# Patient Record
Sex: Male | Born: 1965 | Race: White | Hispanic: No | Marital: Single | Smoking: Former smoker
Health system: Southern US, Community
[De-identification: ages and names within clinical notes are randomized; demographics above are authoritative.]

## PROBLEM LIST (undated history)

## (undated) DIAGNOSIS — F419 Anxiety disorder, unspecified: Secondary | ICD-10-CM

## (undated) DIAGNOSIS — E079 Disorder of thyroid, unspecified: Secondary | ICD-10-CM

## (undated) DIAGNOSIS — A6920 Lyme disease, unspecified: Secondary | ICD-10-CM

## (undated) DIAGNOSIS — F329 Major depressive disorder, single episode, unspecified: Secondary | ICD-10-CM

## (undated) DIAGNOSIS — F32A Depression, unspecified: Secondary | ICD-10-CM

## (undated) DIAGNOSIS — E039 Hypothyroidism, unspecified: Secondary | ICD-10-CM

## (undated) DIAGNOSIS — T7840XA Allergy, unspecified, initial encounter: Secondary | ICD-10-CM

## (undated) DIAGNOSIS — K259 Gastric ulcer, unspecified as acute or chronic, without hemorrhage or perforation: Secondary | ICD-10-CM

## (undated) DIAGNOSIS — K219 Gastro-esophageal reflux disease without esophagitis: Secondary | ICD-10-CM

## (undated) HISTORY — DX: Lyme disease, unspecified: A69.20

## (undated) HISTORY — PX: APPENDECTOMY: SHX54

## (undated) HISTORY — DX: Anxiety disorder, unspecified: F41.9

## (undated) HISTORY — DX: Gastro-esophageal reflux disease without esophagitis: K21.9

## (undated) HISTORY — DX: Allergy, unspecified, initial encounter: T78.40XA

## (undated) HISTORY — PX: CHOLECYSTECTOMY: SHX55

---

## 2000-08-31 ENCOUNTER — Emergency Department (HOSPITAL_COMMUNITY): Admission: EM | Admit: 2000-08-31 | Discharge: 2000-09-01 | Payer: Self-pay | Admitting: Emergency Medicine

## 2000-09-22 ENCOUNTER — Inpatient Hospital Stay (HOSPITAL_COMMUNITY): Admission: EM | Admit: 2000-09-22 | Discharge: 2000-10-01 | Payer: Self-pay | Admitting: Family Medicine

## 2000-09-22 ENCOUNTER — Encounter: Payer: Self-pay | Admitting: Internal Medicine

## 2001-02-21 ENCOUNTER — Encounter: Admission: RE | Admit: 2001-02-21 | Discharge: 2001-02-21 | Payer: Self-pay | Admitting: Rheumatology

## 2001-02-21 ENCOUNTER — Encounter: Payer: Self-pay | Admitting: Rheumatology

## 2001-03-27 ENCOUNTER — Encounter: Admission: RE | Admit: 2001-03-27 | Discharge: 2001-04-25 | Payer: Self-pay | Admitting: Anesthesiology

## 2001-06-15 ENCOUNTER — Encounter: Payer: Self-pay | Admitting: Anesthesiology

## 2001-06-15 ENCOUNTER — Ambulatory Visit (HOSPITAL_COMMUNITY): Admission: RE | Admit: 2001-06-15 | Discharge: 2001-06-15 | Payer: Self-pay | Admitting: Anesthesiology

## 2001-12-12 ENCOUNTER — Encounter: Payer: Self-pay | Admitting: Family Medicine

## 2001-12-12 ENCOUNTER — Ambulatory Visit (HOSPITAL_COMMUNITY): Admission: RE | Admit: 2001-12-12 | Discharge: 2001-12-12 | Payer: Self-pay | Admitting: Family Medicine

## 2002-03-02 ENCOUNTER — Encounter: Payer: Self-pay | Admitting: Family Medicine

## 2002-03-02 ENCOUNTER — Ambulatory Visit (HOSPITAL_COMMUNITY): Admission: RE | Admit: 2002-03-02 | Discharge: 2002-03-02 | Payer: Self-pay | Admitting: Family Medicine

## 2002-07-13 ENCOUNTER — Emergency Department (HOSPITAL_COMMUNITY): Admission: EM | Admit: 2002-07-13 | Discharge: 2002-07-14 | Payer: Self-pay | Admitting: Emergency Medicine

## 2002-12-11 ENCOUNTER — Emergency Department (HOSPITAL_COMMUNITY): Admission: EM | Admit: 2002-12-11 | Discharge: 2002-12-11 | Payer: Self-pay | Admitting: Emergency Medicine

## 2002-12-19 ENCOUNTER — Inpatient Hospital Stay (HOSPITAL_COMMUNITY): Admission: AD | Admit: 2002-12-19 | Discharge: 2002-12-20 | Payer: Self-pay | Admitting: Internal Medicine

## 2004-01-11 ENCOUNTER — Emergency Department (HOSPITAL_COMMUNITY): Admission: EM | Admit: 2004-01-11 | Discharge: 2004-01-11 | Payer: Self-pay | Admitting: Emergency Medicine

## 2004-01-12 ENCOUNTER — Emergency Department (HOSPITAL_COMMUNITY): Admission: EM | Admit: 2004-01-12 | Discharge: 2004-01-12 | Payer: Self-pay | Admitting: Emergency Medicine

## 2004-04-30 ENCOUNTER — Emergency Department (HOSPITAL_COMMUNITY): Admission: EM | Admit: 2004-04-30 | Discharge: 2004-04-30 | Payer: Self-pay | Admitting: Emergency Medicine

## 2004-07-16 ENCOUNTER — Emergency Department (HOSPITAL_COMMUNITY): Admission: EM | Admit: 2004-07-16 | Discharge: 2004-07-16 | Payer: Self-pay | Admitting: Emergency Medicine

## 2005-02-13 ENCOUNTER — Emergency Department (HOSPITAL_COMMUNITY): Admission: EM | Admit: 2005-02-13 | Discharge: 2005-02-14 | Payer: Self-pay | Admitting: Emergency Medicine

## 2005-08-16 ENCOUNTER — Emergency Department (HOSPITAL_COMMUNITY): Admission: EM | Admit: 2005-08-16 | Discharge: 2005-08-16 | Payer: Self-pay | Admitting: Emergency Medicine

## 2005-10-30 ENCOUNTER — Encounter: Payer: Self-pay | Admitting: Gastroenterology

## 2005-10-31 ENCOUNTER — Inpatient Hospital Stay (HOSPITAL_COMMUNITY): Admission: EM | Admit: 2005-10-31 | Discharge: 2005-11-02 | Payer: Self-pay | Admitting: Emergency Medicine

## 2005-10-31 ENCOUNTER — Encounter (INDEPENDENT_AMBULATORY_CARE_PROVIDER_SITE_OTHER): Payer: Self-pay | Admitting: *Deleted

## 2005-11-05 ENCOUNTER — Ambulatory Visit: Payer: Self-pay | Admitting: Gastroenterology

## 2005-11-16 ENCOUNTER — Ambulatory Visit: Payer: Self-pay | Admitting: Gastroenterology

## 2005-12-21 ENCOUNTER — Ambulatory Visit: Payer: Self-pay | Admitting: Gastroenterology

## 2006-03-05 ENCOUNTER — Emergency Department (HOSPITAL_COMMUNITY): Admission: EM | Admit: 2006-03-05 | Discharge: 2006-03-05 | Payer: Self-pay | Admitting: Emergency Medicine

## 2006-03-10 ENCOUNTER — Ambulatory Visit (HOSPITAL_BASED_OUTPATIENT_CLINIC_OR_DEPARTMENT_OTHER): Admission: RE | Admit: 2006-03-10 | Discharge: 2006-03-10 | Payer: Self-pay | Admitting: Urology

## 2006-03-10 ENCOUNTER — Encounter (INDEPENDENT_AMBULATORY_CARE_PROVIDER_SITE_OTHER): Payer: Self-pay | Admitting: Specialist

## 2006-07-07 ENCOUNTER — Ambulatory Visit (HOSPITAL_BASED_OUTPATIENT_CLINIC_OR_DEPARTMENT_OTHER): Admission: RE | Admit: 2006-07-07 | Discharge: 2006-07-07 | Payer: Self-pay | Admitting: Urology

## 2006-07-07 ENCOUNTER — Encounter (INDEPENDENT_AMBULATORY_CARE_PROVIDER_SITE_OTHER): Payer: Self-pay | Admitting: *Deleted

## 2006-08-29 ENCOUNTER — Emergency Department (HOSPITAL_COMMUNITY): Admission: EM | Admit: 2006-08-29 | Discharge: 2006-08-30 | Payer: Self-pay | Admitting: Emergency Medicine

## 2007-03-01 ENCOUNTER — Ambulatory Visit (HOSPITAL_COMMUNITY): Admission: RE | Admit: 2007-03-01 | Discharge: 2007-03-01 | Payer: Self-pay | Admitting: Anesthesiology

## 2007-08-11 ENCOUNTER — Ambulatory Visit (HOSPITAL_BASED_OUTPATIENT_CLINIC_OR_DEPARTMENT_OTHER): Admission: RE | Admit: 2007-08-11 | Discharge: 2007-08-11 | Payer: Self-pay | Admitting: Urology

## 2007-08-11 ENCOUNTER — Encounter (INDEPENDENT_AMBULATORY_CARE_PROVIDER_SITE_OTHER): Payer: Self-pay | Admitting: Urology

## 2008-01-29 ENCOUNTER — Encounter: Admission: RE | Admit: 2008-01-29 | Discharge: 2008-01-29 | Payer: Self-pay | Admitting: Internal Medicine

## 2008-01-29 ENCOUNTER — Encounter: Payer: Self-pay | Admitting: Gastroenterology

## 2008-01-30 ENCOUNTER — Encounter: Payer: Self-pay | Admitting: Gastroenterology

## 2008-01-31 ENCOUNTER — Telehealth: Payer: Self-pay | Admitting: Gastroenterology

## 2008-02-01 ENCOUNTER — Ambulatory Visit: Payer: Self-pay | Admitting: Internal Medicine

## 2008-02-01 DIAGNOSIS — R1012 Left upper quadrant pain: Secondary | ICD-10-CM

## 2008-02-01 DIAGNOSIS — R109 Unspecified abdominal pain: Secondary | ICD-10-CM | POA: Insufficient documentation

## 2008-02-01 DIAGNOSIS — Z8619 Personal history of other infectious and parasitic diseases: Secondary | ICD-10-CM

## 2008-02-01 DIAGNOSIS — K2970 Gastritis, unspecified, without bleeding: Secondary | ICD-10-CM | POA: Insufficient documentation

## 2008-02-01 DIAGNOSIS — R112 Nausea with vomiting, unspecified: Secondary | ICD-10-CM

## 2008-02-01 DIAGNOSIS — K2990 Gastroduodenitis, unspecified, without bleeding: Secondary | ICD-10-CM

## 2008-08-05 ENCOUNTER — Ambulatory Visit: Payer: Self-pay | Admitting: Gastroenterology

## 2008-08-07 LAB — CONVERTED CEMR LAB
AST: 43 units/L — ABNORMAL HIGH (ref 0–37)
Albumin: 4.1 g/dL (ref 3.5–5.2)
Alkaline Phosphatase: 92 units/L (ref 39–117)
Basophils Absolute: 0 10*3/uL (ref 0.0–0.1)
Basophils Relative: 0.7 % (ref 0.0–3.0)
Calcium: 9.4 mg/dL (ref 8.4–10.5)
Creatinine, Ser: 1.1 mg/dL (ref 0.4–1.5)
Eosinophils Relative: 4.4 % (ref 0.0–5.0)
Hemoglobin: 13.5 g/dL (ref 13.0–17.0)
Lymphocytes Relative: 44 % (ref 12.0–46.0)
MCHC: 35 g/dL (ref 30.0–36.0)
Neutro Abs: 2.6 10*3/uL (ref 1.4–7.7)
Potassium: 4.6 meq/L (ref 3.5–5.1)
RDW: 12.4 % (ref 11.5–14.6)
Sodium: 141 meq/L (ref 135–145)
Total Protein: 7.3 g/dL (ref 6.0–8.3)
WBC: 5.9 10*3/uL (ref 4.5–10.5)

## 2008-08-15 ENCOUNTER — Encounter (INDEPENDENT_AMBULATORY_CARE_PROVIDER_SITE_OTHER): Payer: Self-pay | Admitting: General Surgery

## 2008-08-15 ENCOUNTER — Ambulatory Visit (HOSPITAL_COMMUNITY): Admission: RE | Admit: 2008-08-15 | Discharge: 2008-08-16 | Payer: Self-pay | Admitting: General Surgery

## 2009-06-14 ENCOUNTER — Emergency Department (HOSPITAL_BASED_OUTPATIENT_CLINIC_OR_DEPARTMENT_OTHER): Admission: EM | Admit: 2009-06-14 | Discharge: 2009-06-14 | Payer: Self-pay | Admitting: Emergency Medicine

## 2009-06-20 ENCOUNTER — Inpatient Hospital Stay (HOSPITAL_COMMUNITY): Admission: RE | Admit: 2009-06-20 | Discharge: 2009-07-07 | Payer: Self-pay | Admitting: Psychiatry

## 2009-06-20 ENCOUNTER — Ambulatory Visit: Payer: Self-pay | Admitting: Psychiatry

## 2009-07-09 ENCOUNTER — Other Ambulatory Visit (HOSPITAL_COMMUNITY): Admission: RE | Admit: 2009-07-09 | Discharge: 2009-07-10 | Payer: Self-pay | Admitting: Psychiatry

## 2009-07-10 ENCOUNTER — Emergency Department (HOSPITAL_COMMUNITY): Admission: EM | Admit: 2009-07-10 | Discharge: 2009-07-10 | Payer: Self-pay | Admitting: Emergency Medicine

## 2009-07-10 ENCOUNTER — Inpatient Hospital Stay (HOSPITAL_COMMUNITY): Admission: AD | Admit: 2009-07-10 | Discharge: 2009-07-11 | Payer: Self-pay | Admitting: Psychiatry

## 2009-07-14 ENCOUNTER — Emergency Department (HOSPITAL_COMMUNITY): Admission: EM | Admit: 2009-07-14 | Discharge: 2009-07-15 | Payer: Self-pay | Admitting: Emergency Medicine

## 2009-07-15 ENCOUNTER — Other Ambulatory Visit (HOSPITAL_COMMUNITY): Admission: RE | Admit: 2009-07-15 | Discharge: 2009-07-16 | Payer: Self-pay | Admitting: Psychiatry

## 2009-07-16 ENCOUNTER — Inpatient Hospital Stay (HOSPITAL_COMMUNITY): Admission: RE | Admit: 2009-07-16 | Discharge: 2009-07-17 | Payer: Self-pay | Admitting: Psychiatry

## 2009-07-16 ENCOUNTER — Ambulatory Visit: Payer: Self-pay | Admitting: Psychiatry

## 2009-11-22 ENCOUNTER — Emergency Department (HOSPITAL_BASED_OUTPATIENT_CLINIC_OR_DEPARTMENT_OTHER): Admission: EM | Admit: 2009-11-22 | Discharge: 2009-11-22 | Payer: Self-pay | Admitting: Emergency Medicine

## 2010-01-29 ENCOUNTER — Ambulatory Visit: Payer: Self-pay | Admitting: Psychiatry

## 2010-01-29 ENCOUNTER — Inpatient Hospital Stay (HOSPITAL_COMMUNITY): Admission: RE | Admit: 2010-01-29 | Discharge: 2010-02-01 | Payer: Self-pay | Admitting: Psychiatry

## 2010-07-09 LAB — COMPREHENSIVE METABOLIC PANEL
AST: 25 U/L (ref 0–37)
Albumin: 4.1 g/dL (ref 3.5–5.2)
CO2: 23 mEq/L (ref 19–32)
Chloride: 109 mEq/L (ref 96–112)
GFR calc non Af Amer: >60 mL/min (ref >60)
Potassium: 3.8 mEq/L (ref 3.5–5.1)

## 2010-07-09 LAB — CBC
MCH: 31.6 pg (ref 26.0–34.0)
MCHC: 33.5 g/dL (ref 30.0–36.0)
MCV: 94.3 fL (ref 78.0–100.0)
RBC: 4.17 MIL/uL — ABNORMAL LOW (ref 4.22–5.81)

## 2010-07-09 LAB — DIFFERENTIAL
Eosinophils Absolute: 0 10*3/uL (ref 0.0–0.7)
Lymphs Abs: 1.7 10*3/uL (ref 0.7–4.0)
Monocytes Absolute: 0.3 10*3/uL (ref 0.1–1.0)

## 2010-07-17 LAB — COMPREHENSIVE METABOLIC PANEL
ALT: 20 U/L (ref 0–53)
AST: 21 U/L (ref 0–37)
Albumin: 4 g/dL (ref 3.5–5.2)
Albumin: 4.3 g/dL (ref 3.5–5.2)
Alkaline Phosphatase: 79 U/L (ref 39–117)
Alkaline Phosphatase: 78 U/L (ref 39–117)
BUN: 11 mg/dL (ref 6–23)
GFR calc Af Amer: >60 mL/min (ref >60)
Potassium: 4.3 mEq/L (ref 3.5–5.1)
Sodium: 141 mEq/L (ref 135–145)
Sodium: 138 mEq/L (ref 135–145)
Total Bilirubin: 0.5 mg/dL (ref 0.3–1.2)
Total Protein: 7.4 g/dL (ref 6.0–8.3)

## 2010-07-17 LAB — URINALYSIS, ROUTINE W REFLEX MICROSCOPIC
Bilirubin Urine: NEGATIVE
Bilirubin Urine: NEGATIVE
Bilirubin Urine: NEGATIVE
Glucose, UA: NEGATIVE mg/dL
Glucose, UA: NEGATIVE mg/dL
Hgb urine dipstick: NEGATIVE
Hgb urine dipstick: NEGATIVE
Ketones, ur: NEGATIVE mg/dL
Ketones, ur: NEGATIVE mg/dL
Protein, ur: NEGATIVE mg/dL
Protein, ur: NEGATIVE mg/dL
Specific Gravity, Urine: 1.021 (ref 1.005–1.030)
pH: 7.5 (ref 5.0–8.0)
pH: 5.5 (ref 5.0–8.0)

## 2010-07-17 LAB — CBC
HCT: 39.1 % (ref 39.0–52.0)
HCT: 40.7 % (ref 39.0–52.0)
MCHC: 34.4 g/dL (ref 30.0–36.0)
MCV: 94.4 fL (ref 78.0–100.0)
Platelets: 262 10*3/uL (ref 150–400)
RBC: 4.14 MIL/uL — ABNORMAL LOW (ref 4.22–5.81)
RDW: 13 % (ref 11.5–15.5)
WBC: 5.4 10*3/uL (ref 4.0–10.5)

## 2010-07-17 LAB — RAPID URINE DRUG SCREEN, HOSP PERFORMED
Amphetamines: NOT DETECTED
Benzodiazepines: POSITIVE — AB
Cocaine: NOT DETECTED
Cocaine: NOT DETECTED
Opiates: NOT DETECTED
Tetrahydrocannabinol: NOT DETECTED
Tetrahydrocannabinol: NOT DETECTED

## 2010-07-17 LAB — ETHANOL: Alcohol, Ethyl (B): <5 mg/dL (ref 0–10)

## 2010-07-17 LAB — BASIC METABOLIC PANEL
BUN: 18 mg/dL (ref 6–23)
Calcium: 9.2 mg/dL (ref 8.4–10.5)
GFR calc non Af Amer: >60 mL/min (ref >60)
Glucose, Bld: 94 mg/dL (ref 70–99)

## 2010-07-17 LAB — DIFFERENTIAL
Basophils Relative: 1 % (ref 0–1)
Monocytes Absolute: 0.3 10*3/uL (ref 0.1–1.0)
Monocytes Relative: 6 % (ref 3–12)
Neutro Abs: 3.1 10*3/uL (ref 1.7–7.7)

## 2010-08-05 LAB — DIFFERENTIAL
Eosinophils Absolute: 0.2 10*3/uL (ref 0.0–0.7)
Lymphocytes Relative: 32 % (ref 12–46)
Lymphs Abs: 1.4 10*3/uL (ref 0.7–4.0)
Monocytes Relative: 6 % (ref 3–12)
Neutrophils Relative %: 55 % (ref 43–77)

## 2010-08-05 LAB — COMPREHENSIVE METABOLIC PANEL
ALT: 18 U/L (ref 0–53)
AST: 21 U/L (ref 0–37)
Calcium: 8.8 mg/dL (ref 8.4–10.5)
Creatinine, Ser: 0.98 mg/dL (ref 0.4–1.5)
GFR calc Af Amer: >60 mL/min (ref >60)
Glucose, Bld: 102 mg/dL — ABNORMAL HIGH (ref 70–99)
Sodium: 141 mEq/L (ref 135–145)
Total Protein: 6 g/dL (ref 6.0–8.3)

## 2010-08-05 LAB — CBC
MCHC: 34.3 g/dL (ref 30.0–36.0)
MCV: 92 fL (ref 78.0–100.0)
RDW: 13.8 % (ref 11.5–15.5)

## 2010-09-08 NOTE — Op Note (Signed)
Glenn Santana, Glenn Santana               ACCOUNT NO.:  [id number]   MEDICAL RECORD NO.:  [id number]          PATIENT TYPE:  AMB   LOCATION:  [facility]                         FACILITY:  [facility]   PHYSICIAN:  Martina Sinner, MD DATE OF BIRTH:  March 02, 1966   DATE OF PROCEDURE:  DATE OF DISCHARGE:                               OPERATIVE REPORT   PREOPERATIVE DIAGNOSES:  Perineal absence.   POSTOPERATIVE DIAGNOSES:  Perineal absence.   PROCEDURE:  Excision of perineal abscess.   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  He has  had this excised before.  It has been refractory to antibiotics.  He is  currently on Keflex.  He has a second lesion in the perineum as well.  The first lesion is in the inferior aspect of his old perineal incision  scar.  There is little bit of an abscess effect with firmness extending  to the left side for 1-1/2 cm.   Both incisions were marked with marking pen.  Elliptical incisions were  used.  The smaller incision was approximately 13 mm in length.  A second  incision was 2-1/2 cm in length.  I removed the skin and a little bit of  subcutaneous tissue.  The base was fulgurated.  Both incisions were  closed with multiple 3-0 chromic sutures cut short.  Hemostasis was  excellent.  Dermabond was applied.  The patient was given preoperative  vancomycin and postop doxycycline.   These may represent MRSA infections and I will speak to Infectious  Diseases in the future.           ______________________________  Martina Sinner, MD  Electronically Signed     SAM/MEDQ  D:  08/11/2007  T:  08/11/2007  Job:  [id number]

## 2010-09-08 NOTE — Op Note (Signed)
NAMESADIK, PIASCIK               ACCOUNT NO.:  [id number]   MEDICAL RECORD NO.:  [id number]          PATIENT TYPE:  OIB   LOCATION:  0098                         FACILITY:  [facility]   PHYSICIAN:  Anselm Pancoast. Weatherly, M.D.DATE OF BIRTH:  1966/02/23   DATE OF PROCEDURE:  08/15/2008  DATE OF DISCHARGE:                               OPERATIVE REPORT   PREOPERATIVE DIAGNOSIS:  Chronic cholecystitis.   POSTOPERATIVE DIAGNOSIS:  Chronic cholecystitis.   OPERATION:  Laparoscopic cholecystectomy with cholangiogram.   ANESTHESIA.:  General.   SURGEON:  Anselm Pancoast. Zachery Dakins, M.D.   ASSISTANT:  Sandria Bales. Ezzard Standing, M.D.   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  His past history, he has had an appendectomy  years ago when he was living in [place], and then about 3 years ago he  was worked up for epigastric pain.  They did ultrasound; did not confirm  stones, but hepatobiliary scan was definitely abnormal and I think he  saw a surgeon, but he thought since his pain was in the upper abdomen  and sometimes to the left that it was not a gallbladder function.  He  recently has had more episodes of pain and contacted GI with the  [facility] and  talked with Dr. Christella Hartigan who reviewed his records and  thought that this was, and suggested that he be seen by a surgeon.  I  saw him early in the week and he said he had been nauseous over the  weekend and had not had recent lab studies, but his symptoms to me, and  now with an ultrasound that definitely shows stone, I think that  definitely is gallbladder and needs to be removed.  Now, whether it is  going to take care of most of his pain, I am not sure.  He has had a  chronic lower extremity problem.  He is on Duragesic patches.  He is on  other patches, antidepressants and also migraine headache medication and  Wellbutrin.  The  patient wanted to proceed as quickly as possible and OR  time was found today.  Preoperatively, the patient's repeat lab studies  are unremarkable.  He weighs about 230 pounds; he has been as heavy as  280 and his lab studies repeated show a normal white count of 4300,  hematocrit of 37 and I think the liver tests were also normal.   He was given Cipro preoperatively as HE IS ALLERGIC TO PENICILLIN and  taken back to the operative suite.  He says he was coming down with a  migraine right as far as the anesthesia and Dr. Rica Mast, his  anesthesiologist, said he had actually recognized him when he had seen  him when he had a cyst removed in the buttocks area by Dr. Sherron Monday.  The patient was positioned OR table.  Induction of general anesthesia,  endotracheal tube, oral tube into the stomach and then the abdomen which  is kind of protuberant, was prepped  with Betadine solution and draped in  a sterile manner.  A small incision was made below the umbilicus after  the time-out with identification, clamp sutures and etc. done and he had  received the Cipro.  The fascia was identified down at the depth that  you could just see it with the deep ends of an [employer] retractor and  then I picked up the fascia and made a little opening.  Could identify  the underlying peritoneum and picked that up also.  I then could see  into the peritoneal cavity after the peritoneum was opened.  A  pursestring suture of O Vicryl was placed and a second camera was  inserted.  He has significant adhesions in the right side of his abdomen  from his previous appendectomy, but not adhesions in the midline and we  could kind of go up.  I could just see the, to the right of the  falciform and the right, the upper 10-mL trocar was placed with direct  vision and then I could use the laparoscopic scissors and cautery to  kind of drop the adhesions from his previous appendectomy.  You could  not actually see the  gallbladder; you could see the omentum kind  adherent over the gallbladder and after the two lateral 5-mm trocars  were placed, we then started dissecting down into the omentum.  Could  just see the top of the gallbladder and then spent probably 45 minutes  trying to basically get this omentum off of a very floppy and a  chronically distended gallbladder, but not a thickened or acute  cholecystitis.  We did enter the gallbladder trying to strip the omentum  off of it and we aspirated the fluid and then continued dissecting and  it was finally getting so that we could get down so we could recognize  some actual gallbladder wall.  Several little vessels within the omentum  were clipped and then cauterized on the gall bladder surface and then we  could kind of get down to the proximal portion of the gallbladder that  was definitely a kind of a thin-walled, chronically distended  gallbladder.  The posterior branch of the cystic artery was first  identified and after we were sure that we clipped it proximally and  distally and divided it, and then we could kind of get to the dependent  portion of the gallbladder.  Dissected it laterally.  We then identified  the main cystic artery, doubly clipped it proximally and singly after we  were definitely confident that this was, and then we could encompass the  cystic duct where it had tapered on down and placed a clip at the  junction of the cystic duct gallbladder.  A small opening was made  proximally and a Cook catheter was introduced and a cholangiogram  obtained after being held in place with the clip.  He has got a small  extrahepatic biliary system with good flow of dye into the duodenum and  the intrahepatic ducts, and probably about a 2-cm, normal-sized cystic  duct.  I triply clipped the proximal cystic duct, then divided it and  the cystic artery had previously been clipped and then I kind of  carefully separated the gallbladder from the  liver bed with good  hemostasis.  The gallbladder was placed in an EndoCatch bag after it was  completely freed up.  We irrigated, aspirated and now satisfied with  hemostasis.  I switched the camera to the upper  10-mm trocar and then  withdrew the bag containing the gallbladder.  There are gallstones.  We  reinserted the camera again, irrigated and looked, were satisfied with  the hemostasis and then placed an additional figure-of-eight, in  addition to the pursestring at the umbilicus and tied both sutures at  the fascia at the umbilicus.  The irrigating fluid was aspirated.  No  evidence of any bleeding or other bile stainage after we had aspirated  with about 2 liters of saline, and then the 5-mm ports were withdrawn  under direct vision.  Where we had dropped the adhesions down was also  inspected and there no evidence of any bleeding and the CO2 released and  the upper 10-mm trocar withdrawn.  I did put a simple stitch in the  fascia at the subxiphoid area and then closed the subcutaneous wounds  with 4-0 Vicryl, benzoin and Steri-Strips on the skin.  The patient  wants to spend the night and we will continue him on his chronic  medications plus Vicodin for pain and hopefully he will be ready for  discharge in the morning.  I think that his pain is going to be  significantly less now.  Some of these chronic pain problems, of course,  will not be affected by having his gallbladder removed, but he has  definitely been having episodes of abdominal pain with all these marked  adhesions.  It was such a big area around the gallbladder.      Anselm Pancoast. Zachery Dakins, M.D.  Electronically Signed     WJW/MEDQ  D:  08/15/2008  T:  08/15/2008  Job:  [id number]   cc:   Rachael Fee, MD  [address]  [place], [place] [address]

## 2010-09-11 NOTE — Discharge Summary (Signed)
NAMERANDE, ROYLANCE               ACCOUNT NO.:  [id number]   MEDICAL RECORD NO.:  [id number]          PATIENT TYPE:  INP   LOCATION:  5732                         FACILITY:  [facility]   PHYSICIAN:  Kari Baars, M.D.  DATE OF BIRTH:  [birth year]   DATE OF ADMISSION:  10/31/2005  DATE OF DISCHARGE:  11/02/2005                                 DISCHARGE SUMMARY   DISCHARGE DIAGNOSES:  1.  Right upper quadrant pain, likely musculoskeletal due to retching.  2.  Mild gastritis.  3.  Recurrent nausea and vomiting, resolved, likely secondary to #2 versus      functional in nature.  4.  Chronic pain syndrome with a questionable diagnosis of prior Lyme      disease.  5.  Migraine headaches.  6.  Depression with generalized anxiety disorder.  7.  Hypothyroidism.  8.  Status post appendectomy.  9.  Mild gastroesophageal reflux disease.  10. Nicotine addiction.   DISCHARGE MEDICATIONS:  1.  Protonix 40 mg p.o. b.i.d.  2.  Flexeril 10 mg q.8h. p.r.n. muscle spasm and pain.  3.  Asacol 400 mg every eight hours as directed by Dr. Vear Clock, apparently      for pain control.  4.  Klonopin 2 mg q.8h.  5.  Lexapro 20 mg daily.  6.  Propranolol SA 80 mg daily.  7.  Morphine sulfate immediate release 30 mg every four hours as needed for      severe pain (no refills or prescription provided).  8.  Fentanyl patch 25 mcg q.48h. as provided by Dr. Vear Clock.  9.  Synthroid 50 mcg daily.  10. Temazepam 15 mg q.h.s. p.r.n. sleep.   HOSPITAL PROCEDURES:  1.  Acute abdominal series October 30, 2005 (bronchitic changes with      nonobstructive bowel gas pattern).  2.  Right upper quadrant ultrasound (October 30, 2005) (gallbladder wall upper      limits of normal in thickness).  Positive sonographic Murphy's sign,      small gallbladder polyps, but no stones were seen.  3.  EGD (Dr. Rob Bunting) mild gastritis.  H. pylori negative.  4.  HIDA scan with ejection fraction (November 01, 2005) patency of cystic and    biliary duct.  Gallbladder ejection fraction at one hour is 49% which is      slightly below the range of normal.   CONSULTATIONS:  Gastroenterology (Dr. Rob Bunting).   HISTORY OF PRESENT ILLNESS:  For full details, please see dictated history  and physical by Dr. Timothy Lasso.  Briefly, Glenn Santana is a 45 year old white male  with a history of chronic pain syndrome, previously questionably diagnosed  as Lyme disease, undergoing extensive treatment, migraine headache, obesity  who presented to the emergency room on October 30, 2005 with recurrent nausea  and vomiting and abdominal pain which had progressed over the past two  weeks.  He stated that initially he developed postprandial nausea and  vomiting about 30 minutes after he would eat.  This was followed by severe  right upper quadrant pain.  The pain was worse with certain movements  and  possibly worse after eating.  He had no change in bowel symptoms.  He  presented to the emergency department where acute abdominal series showed no  significant obstruction.  Right upper quadrant ultrasound was performed.  It  was relatively unremarkable.  General surgery (Dr. Lindie Spruce) consulted on the  patient in the emergency department and did not feel that this was surgical  in nature.  They recommended further medical management.   HOSPITAL COURSE:  The patient was admitted to a medical bed.  Gastroenterology was consulted.  The patient was seen by Dr. Rob Bunting  and underwent an EGD on October 31, 2005 which revealed mild gastritis.  He did  not feel that this was a likely culprit for his severe pain.  A HIDA scan  was performed to further evaluate gallbladder disease.  This showed no  significant findings.  After reviewing his history, it seems that his pain  is more musculoskeletal in nature.  He tends to have exacerbation with  movements and with light touch over the skin.  There has been no significant  change in his bowel symptoms to suggest  colitis or inflammatory bowel  disease.  The patient was treated supportively with heating pad, Flexeril,  and p.r.n. Dilaudid which was transitioned to Vicodin.  Initially, he was  requiring large amounts of narcotics but this was weaned down to p.o.  Vicodin with excellent control in the morning of discharge.  I discussed the  patient's syndrome with him at length.  There does appear to be a large  component of anxiety or depression relating to this episode.  The patient  does seem to have an exaggerated response to all of his medical issues which  I think stem from prior life experiences.  I have encouraged him to follow-  up with his psychologist to discuss these issues.  He should also follow-up  with Dr. Vear Clock for ongoing pain management and consideration of  additional treatment for his right upper quadrant pain which is likely  musculoskeletal in etiology.  I have not given him any additional pain  medicines at this time.  We are recommending t.i.d. PPI to help with his  mild gastritis.   DISCHARGE INSTRUCTIONS:  The patient was instructed to call with worsening  abdominal pain or inability to tolerate p.o.'s.   LABORATORY DATA:  CBC on the morning of discharge white count 6.0,  hemoglobin 13.5, platelets 202.  BMET normal with sodium 138, potassium 3.6,  chloride 105, bicarb 27, BUN 10, creatinine 0.9, glucose 91.  H. pylori  negative.  B12 normal at 419.  TSH 5.5.  Amylase and lipase were normal on  admission.  Liver function tests were completely normal on admission as  well.   DISPOSITION:  To home with family.   FOLLOWUP:  He will follow up with Dr. Vear Clock on Thursday as scheduled.  He  will follow up with Dr. Clelia Croft at [facility] in two weeks.  He will follow up with GI as needed.      Kari Baars, M.D.  Electronically Signed     WS/MEDQ  D:  11/02/2005  T:  11/02/2005  Job:  [id number]

## 2010-09-11 NOTE — Assessment & Plan Note (Signed)
[facility]                           GASTROENTEROLOGY OFFICE NOTE   Glenn Santana, Glenn Santana                      MRN:          [id number]  DATE:12/21/2005                            DOB:          12-22-1965    PROBLEM LIST:  1.   New acute diarrhea, watery nonbloody stools began shortly after his      hospitalization in early July.  Of note, he was on antibiotics around      the same time for upper respiratory symptoms.  Workup in progress.  2.  Nausea and vomiting, chronic.  Unclear etiology.  Workup to date      includes essentially normal EGD July 2007, CLO biopsy negative.      Essentially normal HIDA scan July 2007 with a very minimally reduced      gallbladder ejection fraction.  Ultrasound July 2007 with mildly      thickened gallbladder (The patient was evaluated by General Surgery who      did not feel this was signs of cholecystitis.)  Persistently normal      liver tests and CBC.   INTERVAL HISTORY:  I last saw Glenn Santana one month ago at that time he began  noticing rather acute onset diarrhea.  It has now been going on  approximately two months.  He describes this as relatively loose stools,  some urgency, these can come and go but is definitely a change from what he  is used to.  This did seem to be timed around shortly after some antibiotics  for an upper respiratory infection so I thought this was probably C-  difficile related.  He did have stool testing done and these were all  negative including Giardia, corpus peridium, C-diff.  White cells were  negative.  Routine stool culture was negative.  My suspicion was high enough  that I did treat him with Flagyl 250 mg t.i.d. He took this for a two week  course and did not really know too much of a difference after that.  For the  past week or so he has been on Doxycycline for a bug bite on his finger.   CURRENT MEDICATIONS:  1. Asacol.  2. Clonazepam.  3. Lexapro.  4.  Levothyroxine.  5. Propranolol.  6. Phentanyl.  7. __________  8. Prilosec.  9. Doxycycline which he just finished.   PHYSICAL EXAMINATION:  VITAL SIGNS:  Weight 201 pounds.  Up one pound since  his last visit.  Blood pressure 120/72.  Pulse 60.  CONSTITUTIONAL:  Generally well-appearing neurological, alert and oriented  x3.  EYES:  Extra-ocular movements intact.  ABDOMEN:  Soft, non-tender, non-distended.  Normal bowel sounds.   ASSESSMENT/PLAN:  32. A 45 year old man with two months of loose stools.   RECOMMENDATIONS:  I recommended that we proceed with colonoscopy as soon as  convenience, however he is very reluctant to proceed with that because he  feels it will be very uncomfortable and he also does not know that he wants  to take off time from work.  He will get  back in touch with  me if he changes his mind.  In the meantime, I told him  to start taking Imodium one pill once to twice a day and that may help his  bowels firm up a bit.                                   Rachael Fee, MD   DPJ/MedQ  DD:  12/21/2005  DT:  12/22/2005  Job #:  [id number]   cc:   Kari Baars, MD

## 2010-09-11 NOTE — Discharge Summary (Signed)
[facility]  Patient:    Glenn Santana, Glenn Santana                      MRN: [id number] Adm. Date:  [id number] Disc. Date: [id number] Attending:  Janifer Adie CC:         Arvella Merles, M.D., [facility]  Dr. Yetta Barre, Dermatologist  Dewayne Shorter, M.D.  Rockey Situ. Flavia Shipper., M.D.   Discharge Summary  DATE OF BIRTH:  Jul 23, 1965  DISCHARGE DIAGNOSES: 1. Intractable lower extremity pain of unknown etiology. 2. Possible Lyme disease. 3. Smoking. 4. Mild elevation of the diastolic dysfunction, needs outpatient follow-up. 5. Allergies to penicillin.  DISCHARGE MEDICATIONS: 1. Doxycycline 100 mg p.o. b.i.d. x 14 more days (to complete 28-day therapy). 2. Prednisone 5 mg p.o. q.d. x 1 week, then 2.5 mg p.o. q.d. x 1 week, then    stop. 3. Klonopin 0.5 mg b.i.d. 4. Celexa 10 mg p.o. q.d. 5. Colace 100 mg p.o. q.d. 6. OxyContin 40 mg p.o. b.i.d. (20 pills given). 7. OxyIR ______ 10 mg q.4h. breakthrough pain p.r.n., 30 tablets given. 8. Phenergan 25 mg q.6h. p.r.n.  DISCHARGE FOLLOW-UP AND DISPOSITION:  Glenn Santana is being discharged home to the care of his family.  A home health physical therapy will be provided to continue with increased strengthening and ambulation skills.  The patient will be followed by Dr. Tiburcio Pea within three to five days in [facility].  The severe pain complaints remain completely unclear.  We all feel that the pain was completely out of proportion than that normally seen in these cases.  These complaints of pain were completely inconsistent. The patient was seen taking showers in the hospital and he was able to get in the wheelchair to go outside to smoke on multiple occasions despite medical advice.  We believe that this patient could benefit from a psychological and psychiatric evaluation as well as a possible referral to the pain clinic if his complaints of pain do not improve.  There was  not any evidence of focal weakness, no neuropathic pain.  CONSULTATIONS:  Dr. Lenn Sink and Dr. Cliffton Asters, infectious diseases.  PROCEDURES:  Chest x-ray negative.  DISCHARGE LAB DATA:  Negative HIV ELISA, negative HIV viral load by PCR, negative rheumatoid factor, negative RPR, negative HLA-B B27, negative serologies for Lyme disease, negative ANA, negative RA, negative serologies for hepatitis B and C, normal CK, normal sedimentation rate.   C and B serologies pending.  Epstein-Barr virus IgG positive at 4.40, IgM negative consistent with a remote infection.  Lower extremity venous Dopplers negative.  HOSPITAL COURSE:  Glenn Santana is a very pleasant 45 year old man admitted to [facility] on Sep 22, 2000, with a five-week history of transient painful skin rash in the lower extremities, subjective fever, headaches, ankle and knee pain that started with mild synovitis and questionable arthritis. Please see admission H&P by Dr. Charissa Bash for further details regarding the history of presentation, physical exam, and lab data.  #1 - POSSIBLE LYME DISEASE:  Given the patients presentation of skin rash in the lower extremities for about five weeks associated with arthralgias, subjective fever, a tick-borne infection was considered among other etiologies.  The sedimentation rate was completely negative and vasculitis was not evident.  The patient had been followed by Dr. Yetta Barre, dermatologist in town who had placed Glenn Santana on NSAIDS and prednisone as well as pain control.  The  patient received IV fluids and intravenous morphine for pain control.  Dr. Lenn Sink saw the patient on day #2.  Studies for HIV, syphilis, CMV, and Epstein-Barr virus, autoimmune processes like rheumatoid arthritis, and other autoimmune diseases related were obtained,  The patient was started on IV doxycycline.  All these studies came back negative.  Despite negative line serologies, the  infectious diseases consultant recommended to continue and complete a 28-day therapy with doxycycline.  The etiology behind the patients symptoms and complaints remain completely unclear by the time of discharge.  No deep venous thrombosis was found by lower extremity venous Dopplers.  No skin rash was documented during this hospital stay except for occasional localized small patch of erythema in the left calf.  No signs of objective inflammation of the knees or ankles were noticed throughout this hospital stay.  The patient was complaining of intractable pain in the right knee and right ankle that was completely out of proportion.  The patient was evaluated by physical therapy who noticed excruciating pain with minimal movement.  This pain was inconsistent as this patient was able to take some showers ______ with his friend here in the hospital, and he was able to transfer to the wheelchair without problems, to go downstairs to smoke for at least six times a day.  Despite his inconsistent history of pain, we continued using intravenous pain management.  We started MS Contin and, then, this medication was switched to OxyContin.  OxyIR was used for breakthrough pain. The patient started having some improvement in his pain though he insisted on getting intravenous morphine as he desired.  The nursing staff raised the concern of the drug seeking behavior.  Overall, the nursing staff team and our team felt that the patients request for narcotics and his excruciating pain was inconsistent and was suspicious.  I believe that this gentleman has a psychological/psychiatric disorder.  He was reluctant to see different doctors as he stated multiple times, "I just keep seeing multiple doctors and  everybodys giving me a different story."  The daily interactions with this gentleman were difficult as he continued requesting to stay in the hospital when all of his lab data remained negative and,  objectively, there was not an unstable medical problem.  Finally, we confronted Glenn Santana about the need to have to be discharged from the hospital and continue with medical management as an outpatient.  Dr. Holley Bouche would be in charge of creating a contract and plan with this patient.  We believe that this plan should be a combination of psychiatry/psychological evaluation as well as possible pain clinic referral.  #2 - INTRACTABLE RIGHT KNEE AND ANKLE PAIN OF UNKNOWN ETIOLOGY:  As in problem #1.  #3 - TOBACCO ABUSE:  We discussed on multiple occasions smoking cessation though the patient declined nicotine patch or any attempt of cessation. Dr. Leonides Sake will continue encouraging this patient to quit smoking.  #4 - MILD DIASTOLIC BLOOD PRESSURE ELEVATION:  During this hospital stay, some borderline mild hypertension was noticed.  We decided not to start therapy as this patient had continuous complaints of pain.  Dr. Holley Bouche will continue monitoring the patients blood pressure and decide if Mr. Pape needs antihypertensive agents.  I spent about 50 minutes in the process of discharging this patient.  Medical condition at the time of discharge has improved. DD:  10/01/00 TD:  10/02/00 Job: [id number]

## 2010-09-11 NOTE — H&P (Signed)
NAME:  BIRAN, MAYBERRY                         ACCOUNT NO.:  [id number]   MEDICAL RECORD NO.:  [id number]                   PATIENT TYPE:  INP   LOCATION:  0462                                 FACILITY:  [facility]   PHYSICIAN:  Corinna L. Lendell Caprice, MD             DATE OF BIRTH:  07/23/1965   DATE OF ADMISSION:  12/19/2002  DATE OF DISCHARGE:                                HISTORY & PHYSICAL   CHIEF COMPLAINT:  Pain.   HISTORY OF PRESENT ILLNESS:  [name] is a 45 year old white male with Lyme  disease and chronic right leg pain who was directly admitted from Dr.  Johnathan Hausen office with intractable pain.  He reports that the pain started two  years ago and he has seen multiple specialists including a pain management  doctor and a Lyme disease specialist.  The pain has been manageable despite  its chronicity; however, recently he reported that Neurontin was started and  this made his pain worse.  Since then, he has gone downhill and he is barely  able to ambulate even with a cane.  It keeps him up at night and he is only  sleeping about three hours a night.  He is unable to work and is cared for  by his mother.  He takes Duragesic patches as well as morphine as needed as  well as multiple anxiolytics and sleep aids, but the pain is unrelenting.  The workup has been very extensive including infectious disease serologies,  connective tissue workup, neurologic workup, orthopedic workup, pain  management, physical therapy, etc.  He was recently started on Minocin and  azithromycin for the Lyme disease and had been treated for Lyme disease with  doxycycline several years ago.   PAST MEDICAL HISTORY:  As above.  In addition he has depression and anxiety.   SOCIAL HISTORY:  The patient smokes.  He lives with his mother.  He drinks  occasionally.  He is unemployed.   FAMILY HISTORY:  Noncontributory.   REVIEW OF SYSTEMS:  CONSTITUTIONAL:  No fevers, chills.  He does have a  problem with  weight gain which he attributes to courses of prednisone.  HEENT:  He also suffers from occasional migraine though has no headache now.  RESPIRATORY:  No cough, shortness of breath.  CARDIOVASCULAR:  No chest pain  or palpitations.  GI:  No nausea, vomiting.  GU:  No dysuria.  MUSCULOSKELETAL:  As above.  PSYCHIATRIC:  As above.  NEUROLOGICAL:  No  seizures.  ENDOCRINE:  No diabetes.  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   PHYSICAL EXAMINATION:  VITAL SIGNS:  The patient has normal vital signs.  GENERAL:  The patient is slightly overweight, in no acute distress.  He  appears slightly groggy.  HEENT:  Normocephalic, atraumatic.  Pupils equal, round, reactive to light.  Sclerae nonicteric.  Moist mucous membranes.  Extraocular movements are  intact.  NECK:  Supple.  No lymphadenopathy.  No thyromegaly.  LUNGS:  Clear to auscultation bilaterally without wheezes, rhonchi, or  rales.  CARDIOVASCULAR:  Regular rate and rhythm without murmurs, rubs, or gallops.  ABDOMEN:  Normal bowel sounds.  Soft, nontender, nondistended.  GENITOURINARY  AND RECTAL:  Deferred.  EXTREMITIES:  He has no edema.  His right leg is exquisitely tender even to  light touch, more so than the left leg and he has decreased range of motion  in both the knees but especially in the ankles due to pain.  PSYCHIATRIC:  The patient has a somewhat flat affect but good eye contact.  NEUROLOGICAL:  The patient is groggy-appearing but oriented.  Sensory motor  exam is grossly intact.  SKIN:  He has an erythematous papular rash along the soles of both feet.   ASSESSMENT AND PLAN:  1. Chronic Lyme disease.  2. Chronic right leg pain now with an exacerbation resulting in decreased     quality of life and ambulatory status.  The patient will be admitted to     the floor.  I will increase his Duragesic patch to 125 mcg q.72 h. and     give a trial of a  morphine PCA.  We will need to be cognitive of the fact     that he is on not only these narcotics but as well he takes temazepam,     Remeron, clonazepam all of which can be sedating.  I will continue his     Zithromax and Minocin and give stool softeners.  Also while he is here, I     will order some physical therapy.  3. Depression.  4. Anxiety.                                               Corinna L. Lendell Caprice, MD    CLS/MEDQ  D:  12/19/2002  T:  12/19/2002  Job:  [id number]   cc:   Holley Bouche, M.D.  [address]  [place], [place] [address]  Fax: [phone]

## 2010-09-11 NOTE — Assessment & Plan Note (Signed)
[facility]                           GASTROENTEROLOGY OFFICE NOTE   Glenn Santana, Glenn Santana                      MRN:          [id number]  DATE:11/16/2005                            DOB:          21-Oct-1965    PRIMARY CARE PHYSICIAN:  Dr. Martha Clan.   PROBLEMS:  1.  New acute diarrhea, watery nonbloody stools began shortly after his      hospitalization in early July.  Of note, he was on antibiotics around      the same time for upper respiratory symptoms.  Workup in progress.  2.  Nausea and vomiting, chronic.  Unclear etiology.  Workup to date      includes essentially normal EGD July 2007, CLO biopsy negative.      Essentially normal HIDA scan July 2007 with a very minimally reduced      gallbladder ejection fraction.  Ultrasound July 2007 with mildly      thickened gallbladder (The patient was evaluated by General Surgery who      did not feel this was signs of cholecystitis.)  Persistently normal      liver tests and CBC.   INTERVAL HISTORY:  I last saw Mr. Dannemiller while he was hospitalized at [facility] for upper abdominal pain, nausea and vomiting.  The pain at  that time had been going on for several weeks.  It was exquisitely tender,  even to very light touch in his right upper quadrant and lower right ribs,  simply brushing his skin on that side elicited dramatic pain.  The pain was  very positional (for example worse when raising his arms).  He had been  nauseous around the same time.  General Surgery consultation did not feel  that his gallbladder, although mildly thickened on ultrasound, was a  problem.  EGD was performed which was essentially normal.  His pain and  perhaps his nausea were contributed to possibly functional symptoms.  He  tells me now that he has had similar pains in his right upper quadrant  dating back to the time when he had his appendix out 20 years ago.  Apparently, he required some type of percutaneous  nerve blockage shortly  within a few months of having his appendix removed to numb some nerves that  were irritated.  He has also had chronic pains in his abdomen and in his  legs from Lyme disease.  He regularly sees a Pain Clinic specialist and is  on Fentanyl patch 25 mg every other day as well as morphine p.r.n. which he  seems to take on a daily basis.  This is prescribed for leg pain, but he  also takes it for intermittent abdominal pain as well.   I did not feel that his symptoms in the hospital were GI related.  Since he  left the hospital, he was fairly constipated at first.  He was told to take  the prune juice.  Around the same time he took a Z-pack for some upper  respiratory symptoms, and since then he has had dramatic, watery diarrhea, 3-  4  times a day, sometimes getting him up at night.  He feels like he is weak  and getting dehydrated.  He is also continuing to have the vomiting and pain  that he had prior to his admission.  He had a CT scan yesterday of his  abdomen and pelvis.  CT scan showed no acute inflammatory process in his  abdomen.  However, there were scattered areas of apparent circumferential  transverse colon wall thickening.  However, no definite colonic wall  thickening is confirmed.  I am not seeing this scan myself.   Labs done yesterday show normal CBC, normal complete metabolic profile  except for mildly elevated potassium of 5.6.  Normal amylase and normal  lipase.   CURRENT MEDICATIONS:  1.  Asacol.  2.  Clonazepam.  3.  Lexapro.  4.  Levothyroxine.  5.  Propranolol.  6.  Fentanyl.  7.  __________.  8.  Multivitamin.  9.  Prilosec.  10. P.r.n. Phenergan.  11. P.r.n. morphine.  12. P.r.n. Ativan.  13. P.r.n. cyclobenzaprine.  14. P.r.n. Imitrex.   PHYSICAL EXAMINATION:  VITAL SIGNS:  He weighs 200 pounds, blood pressure  110/72, temperature 97.7.  GENERAL:  Well appearing, although, appears somewhat uncomfortable.  Alert  and oriented  x3.  HEENT:  EYES:  Extraocular movements intact.  MOUTH:  Oropharynx moist.  LUNGS:  Clear to auscultation bilaterally.  HEART:  Regular rate and rhythm.  ABDOMEN:  Soft, exquisitely tender to very light palpation and even soft  touch of his skin and the right side of his abdomen.  Normal bowel sounds.  EXTREMITIES:  No lower extremity edema.  SKIN:  No rash, lesions, __________   ASSESSMENT/PLAN:  A 45 year old man with myriad GI complaints.   First, his pain that brought him to the hospital and continues in his right  upper side did not seem to be GI in origin.  His tenderness is elicited on  very light touch to his skin and light palpation plus multiple imaging  studies seemed to show no evidence of disease in his abdomen.  I cannot  explain his pain.  Perhaps, he has non-rash zoster (?).  Some of his pain  may be functional.  He has chronic migraines, chronic pain syndrome, both of  which are associated with function abdominal discomforts.  For his nausea, I  have recommended that he add Zofran 8 mg once daily to see if that will help  his nausea symptoms.  He does have, however, a new acute diarrhea, and  perhaps he contracted C. difficile from his hospitalization along with  recent antibiotics.  There was a suggestion of colitis on a CT scan,  although, the report is certainly under-whelming for obvious inflammation.   I will have him get his stool studies done today for C. difficile, ova and  parasites, white cells and I have given him a prescription for Flagyl 250 mg  t.i.d.  He will begin taking the Flagyl after he gives the stool sample, as  I have seen many cases of toxin negative C. difficile colitis recently.  I  have also given him a prescription for the Zofran as above.  He will return  to see me in 4-5 weeks time, and I will refer him to his pain specialist to  continue pain medicine treatment.  Rachael Fee, MD   DPJ/MedQ DD:   11/16/2005  DT:  11/16/2005  Job #:  [id number]   cc:   Kari Baars, MD  Kathrin Penner. Vear Clock, MD

## 2010-09-11 NOTE — Op Note (Signed)
NAMEGARNET, OVERFIELD               ACCOUNT NO.:  [id number]   MEDICAL RECORD NO.:  [id number]          PATIENT TYPE:  AMB   LOCATION:  [facility]                         FACILITY[facility]  [facility]   PHYSICIAN:  Martina Sinner, MD DATE OF BIRTH:  01/13/1966   DATE OF PROCEDURE:  DATE OF DISCHARGE:                               OPERATIVE REPORT   PREOPERATIVE DIAGNOSIS:  Draining sinus and sebaceous cyst on the  scrotum.   POSTOPERATIVE DIAGNOSIS:  Draining sinus and sebaceous cyst on the  scrotum.   SURGERY:  Excision of sebaceous cyst and sinus.   Mr. Dyar had a sebaceous cyst excised by Dr. Etta Grandchild but continues to  have a draining sinus in the inferior aspect of his scrotum just at the  junction of the perineum.  It has not responded to sitz baths and  antibiotics.   The patient was prepped and draped in the usual fashion.  I could  palpate the sinus area.  It was not that deep.  I did a wide elliptical  incision and removed the indurated area and approximately 3/4 cm of  subcutaneous tissue.  I did a lot of palpation and visibly and palpably  the entire area was excised.  Approximately one inch inferior to the  incision he may be developing another sebaceous cyst.  I did not feel it  was appropriate to explore this area.  There is no question it was  separate from this problem.  After adequate hemostasis I closed the  subcutaneous tissue with running 3-0 Vicryl.  I closed the skin with  interrupted chromic suture.  A pressure dressing with Telfa was applied.  The patient was taken to the recovery room and, hopefully, this  procedure will prove curative.           ______________________________  Martina Sinner, MD  Electronically Signed     SAM/MEDQ  D:  07/07/2006  T:  07/08/2006  Job:  [id number]

## 2010-09-11 NOTE — Op Note (Signed)
Glenn Santana, Glenn Santana               ACCOUNT NO.:  [id number]   MEDICAL RECORD NO.:  [id number]          PATIENT TYPE:  AMB   LOCATION:  [facility]                         FACILITY:  [facility]   PHYSICIAN:  Claudette Laws, M.D.  DATE OF BIRTH:  [birth year]   DATE OF PROCEDURE:  DATE OF DISCHARGE:  03/05/2006                               OPERATIVE REPORT   PREOPERATIVE DIAGNOSIS:  Sebaceous cyst of the scrotum.   POSTOPERATIVE DIAGNOSIS:  Sebaceous cyst of the scrotum.   PROCEDURE:  Excision of scrotal cyst.   SURGEON:  Claudette Laws, M.D.   ASSISTANT:  Terie Purser, M.D.   ANESTHESIA:  General.   SPECIMEN:  Cyst.   COMPLICATIONS:  None.   DISPOSITION:  Stable to the post anesthesia care unit.   INDICATIONS:  Glenn Santana is a 45 year old male who has a symptomatic  subcutaneous cyst in the inferior aspect of his scrotum.  He has desired  intervention for this.  The benefits and risks of excision were  explained and consent was obtained.   DESCRIPTION OF PROCEDURE:  The patient was brought to the operating room  and properly identified.  A time-out was performed to confirm correct  patient and procedure.  He was administered general anesthesia, given  preoperative antibiotics, and placed in a frog-leg position on the  operating table, prepped and draped in a sterile fashion.  He did have  an approximately 2 cm sebaceous cyst at the inferior aspect of the  scrotum near the perineum.  A 2 cm incision was made overlying the cyst.  The cyst was dissected out sharply and removed intact.  Bleeders were  coagulated.  A subcutaneous layer of 3-0 chromic was placed for  hemostasis.  The skin was then closed, after  copious irrigation of the wound, with a 4-0 running chromic suture.  Collodion was applied to the skin.  A fluff gauze and scrotal support  were placed.  There were no complications.  Please note Dr. Etta Grandchild was  present and participated in all aspects of the procedure.     ______________________________  Terie Purser, MD      Claudette Laws, M.D.  Electronically Signed    JH/MEDQ  D:  03/10/2006  T:  03/10/2006  Job:  [id number]

## 2010-09-11 NOTE — Consult Note (Signed)
[facility]  Patient:    [name], Santana Visit Number: [id number] MRN: [id number]          Service Type: PMG Location: [facility] Attending Physician:  Rolly Salter Dictated by:   Jewel Baize. Stevphen Rochester, M.D. Admit Date:  03/27/2001   CC:         Arvella Merles, M.D.   Consultation Report  [name] is a pleasant 45 year old kindly referred to [name] by Dr. Tiburcio Pea. He is complaining of pain in his right knee and right leg.  Approximately May 2002 he noticed a bite in his left leg that progressed to arthralgias and subsequently was diagnosed as Lyme disease.  He has had extensive work-up in this regard, is a nonsurgical position, and has had declining functional indexes and quality of life indexes.  He describes his pain as a central amplification with notable allodynia and modest pseudo motor changes.  This is directed mostly to the right at this time and he relates poor restorative sleep capacity and functional indexes.  In relating his pain it is an 8/10 on a subjective scale.  This causes depression and moodiness, but states no wish to harm himself or others.  Aching, throbbing, sometimes sharp, weakness, stabbing pain.  He is improved with heat and some medications.  He does describe resistance to his current medication profile. It is made worse with walking, bending, sitting, working, and therapy.  X-rays and MRI as well as bone scan have been obtained and are reviewed.  MEDICATIONS:  1. Clonazepam.  2. OxyContin 40 mg b.i.d.  3. Zoloft.  4. Sonata.  5. Oxycodone one tablet t.i.d.  6. Nexium.  7. Promethazine.  8. Maxalt.  9. Lidoderm. 10. Diclofenac. 11. ______.  ALLERGIES:  PENICILLIN.  States no wish to harm himself or others.  The 14 point review of systems, health and history form are reviewed.  PAST SURGICAL HISTORY:  Appendectomy.  PAST MEDICAL HISTORY:  Remarkable for migraines that have been worked up and benign.  FAMILY  HISTORY:  Remarkable for heart disease, cancer, diabetes, disability, hypertension.  SOCIAL HISTORY:  He is a smoker one pack per day.  I cautioned.  Occasional alcohol.  He is single and lives with his mother.  He is currently not working secondary to his pain.  REVIEW OF SYSTEMS:  Otherwise noncontributory to pain problem.  PHYSICAL EXAMINATION  GENERAL:  Pleasant, obese male sitting comfortably in bed.  Gait, affect, appearance are normal.  Oriented x 3.  HEENT:  Unremarkable.  CHEST:  Clear to auscultation, percussion.  CARDIAC:  Regular rate and rhythm without rub, murmur, gallop.  ABDOMEN:  Soft, nontender, benign.  No hepatosplenomegaly.  EXTREMITIES:  He has modest allodynia to the right knee with decreased range of motion secondary to pain.  He has intact joint with no evidence of pseudo motor changes, erythema, or edema.  He has adequate vascular integrity distal to the presentation.  IMPRESSION:  CRPS to the joint secondary to Lyme disease.  PLAN: 1. Diagnostic and therapeutic lumbar sympathetic block. 2. Continue on OxyContin at this time but I will probably switch him over to    Duragesic as he is finding that he is becoming more resistant to OxyContin    and I think an agent change would be useful. 3. Neurontin and topamax will be escalated.  Added benefit of topamax is    weight control. 4. Add Elavil for help with restorative sleep capacity.  Discontinue Sonata.    Will  continue the clonazepam.  Lidoderm patche is at p.r.n.  I really do    not think that the diclofenac or the ______ is helping [name] that much but we    will not stop these at this time and consider this at a later date. 5. Strongly urge him to consider discontinuation of cigarettes as best outcome    ______ development.  Also consider tens technology and ongoing physical therapy once central amplification is attenuated.  We will see him in follow-up.  Extensive consultation. Dictated by:    Jewel Baize Stevphen Rochester, M.D. Attending Physician:  Rolly Salter DD:  03/28/01 TD:  03/28/01 Job: [id number]

## 2010-09-11 NOTE — Discharge Summary (Signed)
NAME:  Glenn Santana, Glenn Santana                         ACCOUNT NO.:  [id number]   MEDICAL RECORD NO.:  [id number]                   PATIENT TYPE:  INP   LOCATION:  0462                                 FACILITY:  [facility]   PHYSICIAN:  Corinna L. Lendell Caprice, MD             DATE OF BIRTH:  Apr 29, 1965   DATE OF ADMISSION:  12/19/2002  DATE OF DISCHARGE:  12/20/2002                                 DISCHARGE SUMMARY   DISCHARGE DIAGNOSES:  1. Leg pain, improved.  2. Lyme disease.  3. Insomnia.  4. Depression.  5. Anxiety.   DISCHARGE MEDICATIONS:  Same as upon admission with the addition of  trazodone 50 mg p.o. q.h.s. as needed for sleep.  Please see H&P for further  details.   ACTIVITY:  Ad lib.  I have ordered home health and home physical  therapy/occupational therapy.   FOLLOWUP:  Follow up with Dr. Tiburcio Pea.  The patient will call to make an  appointment.   CONDITION ON DISCHARGE:  Stable.   DIET:  Regular.   BRIEF HISTORY:  [name] is a 45 year old white male who was admitted  directly from Holley Bouche, M.D. office with intractable leg pain.  He has  had chronic right leg pain for the past two years due to Lyme disease.  He  reports that after starting Neurontin the pain became so severe that he  could barely ambulate even with a cane.  It was keeping him up at night and  was not relieved by the pain medications that he has at home.  Also, his  anxiolytics and sleep aids were not helping with sleep.  The patient has had  an extensive work-up of this pain as well as rash that he has and it was  felt to be due to Lyme disease.  He is being treated with long-term  antibiotics for this problem.  He was admitted.  I increased his fentanyl  patch to 125 mcg and started him on a morphine PCA and I also added  trazodone to his sleep regimen.  We discussed better sleep hygiene.  The  patient underwent physical therapy and during his hospitalization his pain  improved dramatically and  his sleep was uninterrupted.  He is anxious to go  home but he wants to keep his fentanyl patch at the current dose of 100 mcg  daily rather than adding another 25 mcg  patch.  I think this is reasonable.  Further adjustment in his pain  medications can be made by his primary care physician.  His examination  showed no evidence of other causes of the pain and he has had extensive work-  up previously so no further work-up was done during his hospitalization.  Corinna L. Lendell Caprice, MD    CLS/MEDQ  D:  12/20/2002  T:  12/20/2002  Job:  [id number]   cc:   Holley Bouche, M.D.  [address]  [place], [place] [address]  Fax: [phone]

## 2010-09-11 NOTE — H&P (Signed)
NAMESTARLING, Glenn Santana               ACCOUNT NO.:  [id number]   MEDICAL RECORD NO.:  [id number]          PATIENT TYPE:  INP   LOCATION:  5506                         FACILITY:  [facility]   PHYSICIAN:  Gwen Pounds, MD       DATE OF BIRTH:  03/29/1966   DATE OF ADMISSION:  10/30/2005  DATE OF DISCHARGE:                                HISTORY & PHYSICAL   CHIEF COMPLAINT:  Right upper quadrant pain with nausea and vomiting.   HISTORY OF PRESENT ILLNESS:  This is a 45 year old male with several months  of off-and-on symptoms described as approximately one to two times per month  of 30-minute postprandial nausea and vomiting and abdominal pain.  It would  not last very long.  It would just go away on its own.  About two weeks ago,  he saw Dr. Clelia Croft and told him about the above, but because he was not hurting  acutely and there was nothing specific on physical exam, it was decided just  to watch and have the patient call back if the symptoms worsened.  Unfortunately for the patient, they did worsen approximately one week ago  with increasing symptoms including increasing nausea.  The thought of food  made him feel ill and increasing fatigue.  He called in and made an  appointment for Monday morning at 11 but was not able to make it because he  started getting sicker last night on July 6, at 7 p.m.  Symptoms included  nausea and vomiting, worse after meals or worse with drinking anything,  increasing abdominal pain described as epigastric, right upper quadrant pain  that is knife like and radiates to his back.  Symptoms worsened throughout  today and he called me twice.  I encouraged him to come to the ED for right  upper quadrant ultrasound to rule out cholecystitis.  The ultrasound was  done.  It was unremarkable.  Dr. Lindie Spruce was consulted and does not see that  he has anything surgical.  There are no signs or symptoms to suggest  shingles.  Everything seems neuropathic and anxiety related at  this current  time.  He will be admitted for further evaluation and treatment and pain  control.  He is slightly dehydrated.  He has been getting lots of Dilaudid  with only mild to moderate relief.   PAST MEDICAL HISTORY:  1. Depression.  2. History of strange description of Lyme disease that was diagnosed      around 2005, and states that he is status post treatment.  He describes      that he had a bullseye lesion.  3. History of migraines.  4. History of hypothyroidism.  5. GERD?  6. History of appendectomy.  7. History of thoracic nerve block, as described by the patient.   MEDICATIONS:  1. Chantix but he has not started this yet.  2. Asacol 400-mg tablets, two tablets every 8 hours.  3. Clonazepam 2 mg q.8h.  4. Lexapro 20 daily.  5. Propranolol SA 80 mg daily.  6. Morphine sulfate IR 30 mg p.r.n. pain.  He says he has not used 30 of      these pills since Christmas.  7. Levothyroxine 50-mcg tablet daily.  8. Fentanyl patch 25 mcg per hour, one patch every two days.   SOCIAL HISTORY:  He has a male partner.  He teaches sixth to eighth grade  music.  He smokes.  Does not use drugs.  He is a social drinker.   FAMILY HISTORY:  Mother and father are both alcoholics.  His father died  young with coronary disease.   REVIEW OF SYSTEMS:  He wears glasses.  He does not have any diarrhea.  He  has no fevers.  There has been change in his urine; it has been darker with  him not eating and drinking very well.  Full review of systems was performed  and the positives are listed in the HPI.  Otherwise, all other organ systems  are currently negative.   PHYSICAL EXAMINATION:  VITAL SIGNS:  Temperature 98.2, blood pressure  118/72, heart rate 53, respiratory rate 20.  Satting 96%.  GENERAL:  He is very anxious.  He is crying out in pain and writhing around  in the bed.  HEENT:  Ears, nose and throat:  Oropharynx dry.  There is no JVD.  PULMONARY:  Clear to auscultation bilaterally.   CARDIAC:  Regular.  Fentanyl patch noted on the left shoulder.  ABDOMEN:  Abdominal exam is very interesting because he is very tender just  to light touch. He is tender when he knows you are pressing over it, but he  does not have any rebound or guarding and his abdominal exam is otherwise  soft.  With certain touches, he is extremely tender and does pull away, but  this is not very consistent.  It is difficult to tell if there is a true  Murphy sign.  Bowel sounds are positive and normal.  EXTREMITIES:  No clubbing, cyanosis or edema.  No straight leg raise noted.  SKIN:  There is a right lower quadrant scar and what appears to be striae  around the scar.   ANCILLARY DATA:  Chest x-ray shows bronchitic changes, nonobstructive bowel  gas pattern.  Abdominal ultrasound shows gallbladder polyp, no stones.  There is a positive Murphy sign and questionable acalculous cholecystitis.  White count 8.6, hemoglobin 12.8, platelet count 230.  Sodium 139, potassium  3.8, chloride 105, bicarb 27, BUN 12, creatinine 1, glucose 85, AST 17, ALT  15.  Alk phos 50.  Total bilirubin 0.7.  Amylase 38, lipase 19.  Urinalysis  is negative.   ASSESSMENT:  This is a young man with severe intractable right upper  quadrant and epigastric abdominal pain with nausea, vomiting, and  dehydration.  Abdominal ultrasound was otherwise negative and Dr. Lindie Spruce did  not feel that abdominal exam warrants any surgical options at this time.  The patient is also severely anxious, making exact diagnosis at this time  difficult to determine unless it is related to his anxiety.  I have a strong  suspicion that some of the pain is actually neuropathic which would make his  anxiety worse which would make the pain worse.   PLAN:  1. Admit.  2. IV fluids.  3. Consider HIDA scan, CT scan of his abdomen or GI consult or combination      of all the above. 4. Lyrica 50 mg p.o. b.i.d.  5. Dilaudid as written.  6. Will also place  him on his anxiety medications and give those  aggressively.  7. Check a TSH.  Check a B12.  Check an RPR and consider an HIV test if      necessary.  8. Place him on proton pump inhibitors.  Hemo check his stools and      consider the possibility of gastroesophageal reflux disease and peptic      ulcer disease, although this does not quite fit that.  9. Pain control and will consider pain control consult if not better in      the morning.  He may need thoracic nerve block at this time.  10.There is a strong possibility that a lot of these symptoms and anxiety      and nervous related and psychiatry      consult may be warranted should all the above testing come back      negative.  11.His Lyme disease is very difficult to ascertain, but it appears that he      has been treated for it and he says that he has gotten mixed messages      from physicians in the past as to whether he truly had Lyme disease or      not.      Gwen Pounds, MD  Electronically Signed     JMR/MEDQ  D:  10/31/2005  T:  10/31/2005  Job:  [id number]   cc:   Kari Baars, M.D.  Kathrin Penner. Vear Clock, M.D.

## 2010-11-13 ENCOUNTER — Emergency Department (HOSPITAL_BASED_OUTPATIENT_CLINIC_OR_DEPARTMENT_OTHER)
Admission: EM | Admit: 2010-11-13 | Discharge: 2010-11-14 | Disposition: A | Discharge status: Other Acute Inpatient Hospital | Payer: BC Managed Care – PPO | Source: Home / Self Care | Attending: Emergency Medicine | Admitting: Emergency Medicine

## 2010-11-13 ENCOUNTER — Emergency Department (INDEPENDENT_AMBULATORY_CARE_PROVIDER_SITE_OTHER): Payer: BC Managed Care – PPO

## 2010-11-13 ENCOUNTER — Encounter: Payer: Self-pay | Admitting: *Deleted

## 2010-11-13 DIAGNOSIS — L02219 Cutaneous abscess of trunk, unspecified: Secondary | ICD-10-CM

## 2010-11-13 DIAGNOSIS — N492 Inflammatory disorders of scrotum: Secondary | ICD-10-CM

## 2010-11-13 DIAGNOSIS — N498 Inflammatory disorders of other specified male genital organs: Secondary | ICD-10-CM

## 2010-11-13 DIAGNOSIS — L03314 Cellulitis of groin: Secondary | ICD-10-CM

## 2010-11-13 HISTORY — DX: Depression, unspecified: F32.A

## 2010-11-13 HISTORY — DX: Anxiety disorder, unspecified: F41.9

## 2010-11-13 HISTORY — DX: Disorder of thyroid, unspecified: E07.9

## 2010-11-13 HISTORY — DX: Major depressive disorder, single episode, unspecified: F32.9

## 2010-11-13 LAB — BASIC METABOLIC PANEL
BUN: 17 mg/dL (ref 6–23)
Calcium: 9.2 mg/dL (ref 8.4–10.5)
GFR calc Af Amer: >60 mL/min (ref >60)
GFR calc non Af Amer: >60 mL/min (ref >60)
Glucose, Bld: 87 mg/dL (ref 70–99)

## 2010-11-13 LAB — CBC
Hemoglobin: 12.2 g/dL — ABNORMAL LOW (ref 13.0–17.0)
MCH: 31.5 pg (ref 26.0–34.0)
MCHC: 34.8 g/dL (ref 30.0–36.0)
MCV: 90.7 fL (ref 78.0–100.0)
Platelets: 199 10*3/uL (ref 150–400)
RBC: 3.87 MIL/uL — ABNORMAL LOW (ref 4.22–5.81)

## 2010-11-13 LAB — DIFFERENTIAL
Basophils Relative: 0 % (ref 0–1)
Eosinophils Absolute: 0.1 10*3/uL (ref 0.0–0.7)
Eosinophils Relative: 2 % (ref 0–5)
Lymphs Abs: 2.7 10*3/uL (ref 0.7–4.0)
Monocytes Relative: 9 % (ref 3–12)
Neutrophils Relative %: 56 % (ref 43–77)

## 2010-11-13 MED ORDER — HYDROMORPHONE HCL 2 MG/ML IJ SOLN
2.0000 mg | Freq: Once | INTRAMUSCULAR | Status: AC
  Administered 2010-11-13: 2 mg via INTRAVENOUS
  Filled 2010-11-13: qty 1

## 2010-11-13 MED ORDER — VANCOMYCIN HCL 10 G IV SOLR
1.0000 g | Freq: Once | INTRAVENOUS | Status: AC
  Administered 2010-11-13: 1 g via INTRAVENOUS
  Filled 2010-11-13: qty 1000

## 2010-11-13 MED ORDER — SODIUM CHLORIDE 0.9 % IV SOLN
Freq: Once | INTRAVENOUS | Status: AC
  Administered 2010-11-13: 21:00:00 via INTRAVENOUS

## 2010-11-13 MED ORDER — ONDANSETRON HCL 4 MG/2ML IJ SOLN
4.0000 mg | Freq: Once | INTRAMUSCULAR | Status: AC
  Administered 2010-11-13: 4 mg via INTRAVENOUS
  Filled 2010-11-13: qty 2

## 2010-11-13 MED ORDER — VANCOMYCIN HCL IN DEXTROSE 1-5 GM/200ML-% IV SOLN
INTRAVENOUS | Status: AC
  Administered 2010-11-13: 1 g
  Filled 2010-11-13: qty 200

## 2010-11-13 MED ORDER — CLINDAMYCIN PHOSPHATE 600 MG/50ML IV SOLN
600.0000 mg | Freq: Once | INTRAVENOUS | Status: AC
  Administered 2010-11-13 (×2): 600 mg via INTRAVENOUS
  Filled 2010-11-13: qty 50

## 2010-11-13 MED ORDER — IOHEXOL 300 MG/ML  SOLN
100.0000 mL | Freq: Once | INTRAMUSCULAR | Status: AC | PRN
  Administered 2010-11-13: 100 mL via INTRAVENOUS

## 2010-11-13 NOTE — ED Notes (Signed)
Pt presents to ED today with c/o groin pain for the last month.  Pt has been followed by PMD with no relief of sx.  Pt reports cysts in groin area.

## 2010-11-14 ENCOUNTER — Encounter (HOSPITAL_BASED_OUTPATIENT_CLINIC_OR_DEPARTMENT_OTHER): Payer: Self-pay | Admitting: Emergency Medicine

## 2010-11-14 ENCOUNTER — Inpatient Hospital Stay (HOSPITAL_COMMUNITY)
Admission: AD | Admit: 2010-11-14 | Discharge: 2010-11-19 | DRG: 350 | Disposition: A | Discharge status: Home / Self Care | Payer: BC Managed Care – PPO | Source: Other Acute Inpatient Hospital | Attending: Internal Medicine | Admitting: Internal Medicine

## 2010-11-14 DIAGNOSIS — L732 Hidradenitis suppurativa: Secondary | ICD-10-CM | POA: Diagnosis present

## 2010-11-14 DIAGNOSIS — E039 Hypothyroidism, unspecified: Secondary | ICD-10-CM | POA: Diagnosis present

## 2010-11-14 DIAGNOSIS — F341 Dysthymic disorder: Secondary | ICD-10-CM | POA: Diagnosis present

## 2010-11-14 DIAGNOSIS — Z880 Allergy status to penicillin: Secondary | ICD-10-CM

## 2010-11-14 DIAGNOSIS — N498 Inflammatory disorders of other specified male genital organs: Principal | ICD-10-CM | POA: Diagnosis present

## 2010-11-14 DIAGNOSIS — N509 Disorder of male genital organs, unspecified: Secondary | ICD-10-CM | POA: Diagnosis present

## 2010-11-14 NOTE — ED Provider Notes (Signed)
History     Chief Complaint  Patient presents with  . Groin Pain   HPI Comments: Pt with 4 weeks of groin/scrotal pain and infection. Pt has been treated by Dr. Jacquelyne Balint from urology for what sounds like early scrotal abscesses with doxycycline.  Pt had initially been improving until this week.  Pt started noticing increasing pain not controlled by his meds at home and increasing swelling to his groin/scrotum.  Pt saw a urologist on Wednesday and was told if it wasn't improving by Friday to call.  Pt was unable to get into the office today and came to the ED.  No fevers.  Pt has noted redness without drainage to the R groin/scrotal region.    The history is provided by the patient. No language interpreter was used.    Past Medical History  Diagnosis Date  . Thyroid disease   . Depression   . Anxiety and depression     History reviewed. No pertinent past surgical history.  History reviewed. No pertinent family history.  History  Substance Use Topics  . Smoking status: Never Smoker   . Smokeless tobacco: Not on file  . Alcohol Use: Yes     occasional/social      Review of Systems  Constitutional: Negative.  Negative for fever and chills.  HENT: Negative.   Eyes: Negative.  Negative for discharge and redness.  Respiratory: Negative.  Negative for cough and shortness of breath.   Cardiovascular: Negative.  Negative for chest pain.  Gastrointestinal: Negative.  Negative for nausea, vomiting and abdominal pain.  Genitourinary: Negative for hematuria.  Musculoskeletal: Negative.  Negative for back pain.  Skin: Negative.  Negative for color change and rash.  Neurological: Negative for syncope and headaches.  Hematological: Negative.  Negative for adenopathy.  Psychiatric/Behavioral: Negative.  Negative for confusion.  All other systems reviewed and are negative.    Physical Exam  BP 115/73  Pulse 78  Temp(Src) 98 F (36.7 C) (Oral)  Resp 18  SpO2 99%  Physical Exam    Constitutional: He is oriented to person, place, and time. He appears well-developed and well-nourished.  Non-toxic appearance. He does not have a sickly appearance.  HENT:  Head: Normocephalic and atraumatic.  Eyes: Conjunctivae, EOM and lids are normal. Pupils are equal, round, and reactive to light.  Neck: Trachea normal, normal range of motion and full passive range of motion without pain. Neck supple.  Cardiovascular: Regular rhythm and normal heart sounds.   Pulmonary/Chest: Effort normal and breath sounds normal. No respiratory distress.  Abdominal: Soft. Normal appearance. He exhibits no distension. There is no tenderness. There is no rebound and no CVA tenderness.  Genitourinary: Penis normal. Right testis shows swelling and tenderness. Right testis shows no mass. Left testis shows no mass. Circumcised.       Pt has swelling to R hemiscrotum with tenderness and induration that extends up to suprapubic region to the right and the midline.  No focal areas of fluctuance are palpable.   Musculoskeletal: Normal range of motion.  Neurological: He is alert and oriented to person, place, and time. He has normal strength.  Skin: Skin is warm, dry and intact. No rash noted.  Psychiatric: He has a normal mood and affect. His speech is normal and behavior is normal. Judgment and thought content normal. Cognition and memory are normal.    ED Course  Procedures  MDM Pt with area of cellulitis but concerning for potential abscess that could be deeper and need  more surgical intervention.  Pt had CT scan which showed only a 1.4 cm area of abscess but primarily cellulitis.  Pt does not appear to have Fournier's gangrene at this time and is not a diabetic.  Pt has no crepitus to wound and is not rapidly progressing here.  Pt given IV abx with vanco and clinda since he is allergic to penicillins.  Pt's pain improving after multiple doses of dilaudid.  Called Dr. Isabel Caprice from urology to make aware.  Given  pt could potentially need surgical intervention in the future, pt will be transferred to [facility] and has been accepted by Dr. Romie Minus from [facility] for admission.  Pt ok with transfer.       Nat Christen, MD 11/14/10 [phone]

## 2010-11-15 LAB — CBC
MCH: 31 pg (ref 26.0–34.0)
MCHC: 33 g/dL (ref 30.0–36.0)
Platelets: 211 10*3/uL (ref 150–400)
RDW: 13.1 % (ref 11.5–15.5)

## 2010-11-15 LAB — BASIC METABOLIC PANEL
Calcium: 8.5 mg/dL (ref 8.4–10.5)
GFR calc Af Amer: >60 mL/min (ref >60)
GFR calc non Af Amer: >60 mL/min (ref >60)
Potassium: 3.9 mEq/L (ref 3.5–5.1)
Sodium: 137 mEq/L (ref 135–145)

## 2010-11-15 LAB — GLUCOSE, CAPILLARY: Glucose-Capillary: 84 mg/dL (ref 70–99)

## 2010-11-16 DIAGNOSIS — L03319 Cellulitis of trunk, unspecified: Secondary | ICD-10-CM

## 2010-11-16 DIAGNOSIS — L02219 Cutaneous abscess of trunk, unspecified: Secondary | ICD-10-CM

## 2010-11-17 DIAGNOSIS — N498 Inflammatory disorders of other specified male genital organs: Secondary | ICD-10-CM

## 2010-11-18 LAB — HIV ANTIBODY (ROUTINE TESTING W REFLEX): HIV: NONREACTIVE

## 2010-11-18 LAB — BASIC METABOLIC PANEL
Calcium: 8.8 mg/dL (ref 8.4–10.5)
Chloride: 103 mEq/L (ref 96–112)
Creatinine, Ser: 1.09 mg/dL (ref 0.50–1.35)
GFR calc Af Amer: >60 mL/min (ref >60)
Sodium: 136 mEq/L (ref 135–145)

## 2010-11-18 LAB — CBC
MCV: 93.2 fL (ref 78.0–100.0)
Platelets: 242 10*3/uL (ref 150–400)
RDW: 12.9 % (ref 11.5–15.5)
WBC: 7.2 10*3/uL (ref 4.0–10.5)

## 2010-11-18 NOTE — Consult Note (Signed)
Glenn Santana, TISDALE               ACCOUNT NO.:  [id number]  MEDICAL RECORD NO.:  [id number]  LOCATION:  1302                         FACILITY:  [facility]  PHYSICIAN:  Valetta Fuller, MD    DATE OF BIRTH:  26-Feb-1966  DATE OF CONSULTATION:  11/14/2010 DATE OF DISCHARGE:                                CONSULTATION   REASON FOR CONSULTATION:  Groin/scrotal cellulitis with questionable early abscess formation.  HISTORY OF PRESENT ILLNESS:  Glenn Santana has been a longstanding patient of Dr. McDiarmid.  The patient has had a history of skin abscesses including involvement of scrotum and perineum in the past.  The patient has had involvement into his inguinal area, consistent potentially with hidradenitis.  He has been seen by Dr. Perley Jain and has been on doxycycline.  He was seen in our office by Dr. Laverle Patter recently with increasing pain, but was not felt to have any need for surgical treatment at that time.  The patient was recently evaluated at [facility] with increasing pain and swelling within his groin region and scrotum despite the doxycycline. The patient had been afebrile.  He was noted to have what was felt to be potentially some induration and cellulitis with questionable early abscess formation.  CT scan was performed which showed indurated and inflamed tissue within the right groin and upper scrotum with scrotal wall and growing skin thickening.  There was a questionable very small 1 cm area of possible fluid, consistent with potentially early abscess formation.  He was admitted to the hospitalist service for IV antibiotics and pain management.  PAST MEDICAL HISTORY:  Negative for diabetes.  The patient has a history of hypothyroidism as well as depressive and anxiety disorders.  CURRENT MEDICATIONS:  Wellbutrin, levothyroxine, Topamax, Duragesic patch.  ALLERGIES:  The patient has allergies to, 1. PENICILLIN. 2. SULFA.  SOCIAL HISTORY:  Negative  for tobacco use.  REVIEW OF SYSTEMS:  Positive for chills and significant lower groin and upper scrotal discomfort.  He has had no voiding complaints.  Otherwise negative.  PHYSICAL EXAMINATION:  GENERAL:  The patient is well-developed, well- nourished male. VITAL SIGNS:  He is afebrile.  Blood pressure is 127/83 with respiratory rate of 16 and a pulse of 74. NECK:  No obvious mass or JVD. LUNGS:  Irregular respiratory effort. HEART:  Regular rate and rhythm. ABDOMEN:  Soft and nontender. GENITOURINARY:  Induration in the upper aspect of the lateral aspect of the right scrotum, extending out into the groin.  Underlying testes, adnexal structures were normal bilaterally.  There was no evidence of epididymal orchitis, no evidence of scrotal abscess.  Within the groin, there was a firm area, measuring approximately 4 x 6 cm.  This was primarily indurated without clear evidence of fluctuance.  No evidence of necrotic process. EXTREMITIES:  Without edema.  LABORATORY DATA:  The patient's hemoglobin was 12.2, white blood cell count normal at 7,900.  BMET unremarkable.  ASSESSMENT:  Scrotal/growing cellulitis.  The patient has been admitted to the hospitalist service.  I currently do not see evidence of an abscess that require surgical drainage at this time.  We will monitor his response to vancomycin.  If  his situation progresses or shows no improvement over the next 48-72 hours, then surgical exploration with incision and drainage in the area of the maximum induration may be required.     Valetta Fuller, MD     DSG/MEDQ  D:  11/14/2010  T:  11/15/2010  Job:  [id number]  Electronically Signed by Barron Alvine M.D. on 11/18/2010 08:44:07 AM

## 2010-11-19 LAB — GC/CHLAMYDIA PROBE AMP, URINE: Chlamydia, Swab/Urine, PCR: NEGATIVE

## 2010-11-20 LAB — CULTURE, BLOOD (ROUTINE X 2): Culture: NO GROWTH

## 2010-11-22 NOTE — Discharge Summary (Signed)
Glenn Santana, Glenn Santana               ACCOUNT NO.:  [id number]  MEDICAL RECORD NO.:  [id number]  LOCATION:  1302                         FACILITY:  [facility]  PHYSICIAN:  Baltazar Najjar, MD     DATE OF BIRTH:  1965/06/18  DATE OF ADMISSION:  11/14/2010 DATE OF DISCHARGE:                              DISCHARGE SUMMARY   FINAL DISCHARGE DIAGNOSIS:  Right groin/scrotal cellulitis versus hydradenitis suppurativa.  SECONDARY DISCHARGE DIAGNOSES: 1. Hypothyroidism. 2. Depression/anxiety.  CONSULTATIONS: 1. Urology service.  The patient was seen by Dr. Barron Alvine. 2. Infectious Disease Service, Dr. Johny Sax. 3. General surgery. 4. The patient was seen by [facility].  RADIOLOGY AND IMAGING STUDIES:  CT of pelvis done on November 13, 2010 showed subcutaneous fat stranding and right hemiscrotum wall thickening in keeping with cellulitis.  There is a tiny superficial fluid collection, measuring 1.4 cm which appeared to extend to the skin service.  Otherwise, no significant drainable fluid collection. Punctate calcifications in the right hemiscrotum, most in keeping with a scrotal peel.  This can be confirmed with an emergent followup ultrasound.  BRIEF ADMITTING HISTORY:  Please refer to H&P for more details.  In summary, [name] is a 45 year old pleasant Caucasian man with the above history, who was transferred from [facility] Emergency Room when he presented there with worsening pain and swelling of his scrotum/suprapubic area for about 4 weeks.  He has been followed by Dr. Sherron Monday and was treated with doxycycline for possible abscess. However, subsequently, his swelling and pain had worsened and he developed fever and chills and he decided to go to [facility] Emergency Room from where he was transferred here to [facility] for further treatment and possible surgical intervention.  HOSPITAL COURSE:  The patient was admitted to [facility] and he was started on IV vancomycin and clindamycin and on Dilaudid for pain control.  The patient was seen by Dr. Isabel Caprice from Urology Service in consultation and as per his note, there was no evidence of abscess, requiring surgical drainage at that time and he recommended to continue IV antibiotics and if there is no improvement,he will pursue surgical exploration.  [facility] was also consulted, who recommended to treat with antibiotic and there was no need for surgical intervention.  Infectious Disease was also involved in his care and they are suspecting possible hydradenitis suppurativa.  The patient was seen by Dr. Ninetta Lights who recommended to discharge the patient on clindamycin oral and clindamycin topically.  The patient showed improvement on his erythema and swelling and had a discussion with Dr. Ninetta Lights today and the plan was to discharge him on oral and topical clindamycin and to refer him to dermatology for further evaluation for possible hydradenitis suppurativa.  The patient will also follow with Urology and his PCP as an outpatient.  He received a total of 5 days of IV antibiotics in the hospital and we will discharge him with 7 days of clindamycin orally and topical clindamycin to take with refills. we instructed him to see a dermatologist.  DISCHARGE MEDICATIONS: 1. Clindamycin 300 mg 1 tablet p.o. q.8h. for 7 more days. 2. Clindamycin 1%  lotion, applied to the affect area topically 4 times     a day as instructed. 3. Ambien 10 mg half to one tablet p.o. daily q.h.s. p.r.n. 4. Bupropion XL 150 mg a daily.  He takes with a 300 mg tablets, total     of 450 mg 1 tablet p.o. daily. 5. Cyclobenzaprine 10 mg 1 tablet p.o. daily at bedtime. 6. Excedrin Migraine 2 tablets p.o. twice daily as needed. 7. Fentanyl patch 12.5 mcg per hour 1 patch transdermally every 3     days. 8. Hydrocodone/APAP 10/325 mg 1 tablet p.o. q.6h. p.r.n. 9. Imitrex  100 mg 1 tablet p.o. daily as needed. 10.Clonidine 2 mg 1 tablet p.o. 3 times daily as needed. 11.Levothyroxine 112 mcg 1 tablet p.o. q.a.m. 12.Mirtazapine 30 mg 1 tablet p.o. daily at bedtime. 13.Multivitamin 1 tablet p.o. daily. 14.Topiramate 100 mg p.o. twice daily.  DISCHARGE INSTRUCTIONS: 1. The patient to continue above medications as prescribed. 2. The patient to follow with Urology Service and his PCP within 1     week of discharge. 3. The patient to call [facility] and make an appointment     ASAP. 4. The patient to report any worsening of symptoms or new symptoms to     his PCP or return back to the ER if needed.  CONDITION ON DISCHARGE:  Stable/improved.          ______________________________ Baltazar Najjar, MD     SA/MEDQ  D:  11/19/2010  T:  11/19/2010  Job:  [id number]  cc:   Martina Sinner, MD Fax: [phone]  Lacretia Leigh. Ninetta Lights, M.D. Fax: [phone]  [facility]  Kari Baars, M.D. Fax: [phone]  Electronically Signed by Hannah Beat MD on 11/22/2010 01:07:36 PM

## 2010-11-24 NOTE — Consult Note (Signed)
NAMEATHOL, BOLDS               ACCOUNT NO.:  [id number]  MEDICAL RECORD NO.:  [id number]  LOCATION:  1302                         FACILITY:  [facility]  PHYSICIAN:  Ollen Gross. Vernell Morgans, M.D. DATE OF BIRTH:  1965/05/05  DATE OF CONSULTATION:  11/16/2010 DATE OF DISCHARGE:                                CONSULTATION   REQUESTING PHYSICIAN:  Martina Sinner, M.D. with Urology.  CONSULTING SURGEON:  Ollen Gross. Vernell Morgans, M.D.  REASON FOR CONSULTATION:  Right groin cellulitis.  HISTORY OF PRESENT ILLNESS:  Mr. Molner is a 45 year old white male with a history of major depressive disorder, hypothyroidism and anxiety, who does have a history of several sebaceous cysts and/or abscesses in the groin region.  He states that approximately 4 weeks ago, he got a pustule on his right scrotum.  He went and saw Dr. Sherron Monday in his office, who treated him as an outpatient with doxycycline for 2 weeks. After the 2-week treatment, this area was still there and he gave him another round of doxycycline for two more weeks.  The patient was applying warm compress last Monday and the following morning, he woke up and the area had significantly worsened once his antibiotics had run out.  He had a significant increase in swelling and redness of this area.  He went and follow on with Dr. Mina Marble partners, who informed him that if it gets any worse to call them back.  Apparently, last Friday, he tried to call him back and was unable to reach anybody. He began running fevers and therefore presented to [facility] of [facility].  At that time, the patient was felt to need admission secondary to cellulitis.  The patient was then admitted and referred to [facility] and started on vancomycin and Primaxin.  According to the patient as well as the notes from the chart, the patient's erythema and cellulitic changes have significantly improved since he has been here. He did have a CT scan of the pelvis, which  revealed subcutaneous fat stranding and right hemiscrotal wall thickening consistent with cellulitis.  There was tiny superficial fluid collection measuring 1.4 cm, which appears to extent to the skin surface, but no other significant drainable fluid collection was noted.  At this time, we have been asked to evaluate the patient for assistance with further management.  REVIEW OF SYSTEMS:  Please see HPI.  Otherwise all other systems have been reviewed and are negative.  FAMILY HISTORY:  Noncontributory.  PAST MEDICAL HISTORY: 1. Hypothyroidism. 2. Major depressive disorder. 3. Anxiety. 4. History of Lyme disease.  PAST SURGICAL HISTORY: 1. Open appendectomy. 2. Laparoscopic cholecystectomy. 3. Multiple incisions and drainages of abscesses/sebaceous cysts.  SOCIAL HISTORY:  The patient has a partner, who is present with him in his room.  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  He denies any tobacco, but admits to occasional alcohol use.  ALLERGIES: 1. SULFA 2. PENICILLIN.  MEDICATIONS AT HOME:  Include: 1. Levothyroxine. 2. Excedrin migraine. 3. Imitrex. 4. Doxycycline. 5. Vicodin. 6. Topiramate. 7. Multivitamin. 8. Mirtazapine. 9. Klonopin. 10.Fentanyl patch. 11.Cyclobenzaprine. 12.Bupropion XL. 13.Ambien.  PHYSICAL EXAMINATION:  GENERAL:  Mr. Polanco is a very pleasant,  well- developed, well-nourished 45 year old white male, who is currently lying in bed, in no acute distress. VITAL SIGNS:  Temperature 98.7, pulse 89, respirations 14, blood pressure 124/71. HEENT:  Head is normocephalic, atraumatic.  Sclerae noninjected.  Pupils are equal, round and reactive to light.  Ears and nose without any obvious masses or lesions.  No rhinorrhea.  Mouth is pink.  Throat shows no exudate. HEART:  Regular rate and rhythm.  Normal S1, S2.  No murmurs, gallops or rubs are noted.  He does have palpable carotid, renal, pedal pulses bilaterally. LUNGS:  Clear to  auscultation bilaterally with no wheezes, rhonchi or rales noted.  Respiratory effort is nonlabored. ABDOMEN:  Soft, nontender, nondistended with active bowel sounds.  No masses, hernias or organomegaly are noted. GU:  The patient has a normal male penis and testicles.  His scrotum is soft and non-erythematous.  The right groin extending down the inguinal ring towards the scrotum is quite erythematous and indurated.  No crepitus is noted.  The patient is mons erythematous, but this is retracting from the marked line that has outlined the erythema in prior days.  This area is quite tender.  The scrotum is not tender.  The patient is mildly tender on the mons to the base of the penis. PSYCHIATRIC:  The patient is alert and oriented x3 with an appropriate affect.  LABORATORY DATA AND DIAGNOSTIC STUDIES:  Bluit culltres are negative. Sodium 37, potassium 3.9, glucose 131, BUN 13, creatinine 1.29.  White blood cell count is 8000, hemoglobin 11.5, hematocrit 34.8, platelet count 211,000.  CT scan of the pelvis reveals subcutaneous fat stranding and right hemiscrotal wall thickening consistent with cellulitis.  There is a tiny superficial fluid collection measuring at his long 1.4 cm, which is not large enough to be drainable.  IMPRESSION: 1. Right groin/scrotal cellulitis. 2. Hypothyroidism. 3. Major depressive disorder. 4. Anxiety.  PLAN:  At this time, we agree with continuing the patient on IV antibiotics.  They do not see any evidence of green or necrotizing fasciitis.  They do not see any evidence of large fluid collection that would need any type of surgical intervention or drainage at this time. We will follow the patient along with you.  Thank you for this consultation.     Letha Cape, PA   ______________________________ Ollen Gross. Vernell Morgans, M.D.    KEO/MEDQ  D:  11/16/2010  T:  11/16/2010  Job:  [id number]  CC: Martina Sinner, M.D. Electronically Signed by Barnetta Chapel PA on 11/17/2010 01:23:00 PM Electronically Signed by Chevis Pretty III M.D. on 11/24/2010 03:50:32 AM

## 2010-12-18 NOTE — H&P (Signed)
NAMEBENIAH, MAGNAN               ACCOUNT NO.:  [id number]  MEDICAL RECORD NO.:  [id number]  LOCATION:  1302                         FACILITY:  [facility]  PHYSICIAN:  Della Goo, M.D. DATE OF BIRTH:  [birth year]  DATE OF ADMISSION:  11/14/2010 DATE OF DISCHARGE:                             HISTORY & PHYSICAL   REFERRING PHYSICIAN:  Martina Sinner, M.D.  HISTORY OF PRESENT ILLNESS:  Mr. Saville a 45 year old male who was admitted from a [facility] with worsening pain and swelling of his scrotum.  He states he began with symptoms approximately 4 weeks ago and had been seen by Dr. Sherron Monday, Urologist and treated with doxycycline for possible abscess. Mr. Lindon reports  that he had been improving until early this week when he noted swelling and redness.  He was seen in the urologist office on Wednesday and instructed to continue taking antibiotics and to use warm compresses.  On November 13, 2010, he began having worsening  pain, swelling, erythema, fever and chills.  He was seen in the Emergency Department at [facility] and transferred here for further treatment and possible surgical intervention.  PAST MEDICAL HISTORY: 1. Hypothyroidism. 2. Depression and anxiety. 3. History of a tick bite.  ALLERGIES: 1. PENICILLIN. 2. SULFA.  CURRENT MEDICATIONS: 1. Wellbutrin XL 150 mg daily. 2. Wellbutrin XL 300 mg daily. 3. Levothyroxine 112 mcg daily. 4. Remeron 30 mg daily. 5. Topamax 100 mg daily. 6. Duragesic patch 25 mcg, change every 72 hours. 7. Multivitamin one daily. 8. Excedrin Migraine as needed. 9. Norco 10/325 mg every 6 hours as needed. 10.Sumatriptan. 11.Naproxen 85/500 as needed. 12.Klonopin 2 mg every 8 hours as needed. 13.Flexeril 10 mg every 8 hours as needed. 14.Diclofenac 1.3 patch daily as needed.  FAMILY HISTORY:  Mr. Ahart father is deceased and had history of alcoholism, coronary artery disease, hypertension and  diabetes.  His mother is living.  She is a recovering alcoholic and has a history of TIAs.  SOCIAL HISTORY:  Mr. Tiedt lives with his partner.  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  He denies tobacco use. He uses alcohol occasionally.  REVIEW OF SYSTEMS:  GENERAL:  Positive for fevers and chills. HEENT:  Negative for sinus congestion, dysphasia or sore throat. RESPIRATORY:  He denies cough, shortness of breath. CARDIAC:  No chest pain, orthopnea. GASTROINTESTINAL:  Reports nausea and vomiting.  Denies any diarrhea or abdominal pain, reports mild constipation. GENITOURINARY:  He denies any dysuria.  He reports scrotal pain, swelling, erythema. MUSCULOSKELETAL:  Negative for joint pain or swelling. SKIN:  Negative rash or pruritus. NEUROLOGIC:  No syncope, dizziness. HEMATOLOGIC:  No adenopathy, unusual bruising or bleeding.  All systems reviewed and are otherwise negative except as stated.  PHYSICAL EXAMINATION:  VITAL SIGNS:  Temperature 98.5, pulse 75, blood pressure 127/83, respirations 18, oxygen saturation 100% on room air. GENERAL:  Mr. Wilkerson is alert, oriented.  He is in no acute distress. HEENT: Atraumatic, normocephalic.  Sclerae anicteric.  Oropharynx is clear. NECK:  Supple without palpable adenopathy or thyromegaly. LUNGS:  Clear to auscultation and percussion bilaterally. CARDIAC EXAMINATION:  Regular rate and rhythm.  No murmurs, rubs or gallops. ABDOMEN:  Soft, nontender.  Bowel sounds times all four quadrants.  No organomegaly or masses. GENITOURINARY:  Swelling, erythema of the right hemiscrotum with induration.  There is also tenderness.  No fluctuance noted. EXTREMITIES:  No edema, cyanosis or clubbing. NEUROLOGIC EXAMINATION:  Cranial nerves II through XII are grossly intact.  There are no focal deficits.  LABORATORY DATA:  WBC 7.9, hemoglobin 12.2gm/dL, hematocrit 04.5, platelet count 199,000.  Differential:  Seg neutrophils 56%,  lymphocytes 34%, monocytes 9%, eosinophils 2%.  BMET:  Sodium 138, potassium 3.6, chloride 104, CO2 23, BUN 17, creatinine 1.1, glucose 87.  IMPRESSION AND PLAN: 1. Scrotal abscess/cellulitis:  Admit to [facility] for     intravenous antibiotics with vancomycin and clindamycin.  Consider     surgical consult. 2. Pain control:  Dilaudid 1-2 mg every 3 hours as needed.  He will     continue his fentanyl patch at 25 mcg daily, Tylenol 650 every 4     hours as needed for fever/temperature greater than 100.5. 3. Depression, anxiety:  Continue Wellbutrin XL 450 mg daily,     Remeron 30 mg at bedtime and Klonopin 2 mg every 8 hours     as needed for anxiety. 4. Hypothyroidism:  Continue levothyroxine at 112 mcg daily. 5. Fluids, electrolytes, nutrition:  Normal saline at 125 mL per hour.     Monitor electrolytes and address as needed.     N.p.o. pending surgical consult. 6. Prophylaxis:  Sequential compression devices for deep venous     thrombosis prophylaxis. 7. Full code.    ______________________________ Elray Mcgregor, NP   ______________________________ Della Goo, M.D.    ML/MEDQ  D:  11/14/2010  T:  11/14/2010  Job:  [id number]  Electronically Signed by Elray Mcgregor ANP-BC on 12/18/2010 91:47:82 PM

## 2010-12-27 ENCOUNTER — Emergency Department (HOSPITAL_COMMUNITY)
Admission: EM | Admit: 2010-12-27 | Discharge: 2010-12-27 | Disposition: A | Discharge status: Home / Self Care | Payer: BC Managed Care – PPO | Attending: Emergency Medicine | Admitting: Emergency Medicine

## 2010-12-27 DIAGNOSIS — N498 Inflammatory disorders of other specified male genital organs: Secondary | ICD-10-CM | POA: Insufficient documentation

## 2012-02-23 ENCOUNTER — Encounter (HOSPITAL_COMMUNITY): Payer: Self-pay | Admitting: *Deleted

## 2012-02-23 ENCOUNTER — Observation Stay (HOSPITAL_COMMUNITY)
Admission: EM | Admit: 2012-02-23 | Discharge: 2012-02-24 | Disposition: A | Discharge status: Home / Self Care | Payer: BC Managed Care – PPO | Attending: Emergency Medicine | Admitting: Emergency Medicine

## 2012-02-23 DIAGNOSIS — F341 Dysthymic disorder: Secondary | ICD-10-CM | POA: Insufficient documentation

## 2012-02-23 DIAGNOSIS — Z79899 Other long term (current) drug therapy: Secondary | ICD-10-CM | POA: Insufficient documentation

## 2012-02-23 DIAGNOSIS — E039 Hypothyroidism, unspecified: Secondary | ICD-10-CM | POA: Insufficient documentation

## 2012-02-23 DIAGNOSIS — R42 Dizziness and giddiness: Secondary | ICD-10-CM | POA: Insufficient documentation

## 2012-02-23 DIAGNOSIS — E860 Dehydration: Principal | ICD-10-CM | POA: Insufficient documentation

## 2012-02-23 HISTORY — DX: Hypothyroidism, unspecified: E03.9

## 2012-02-23 LAB — BASIC METABOLIC PANEL
Chloride: 101 mEq/L (ref 96–112)
GFR calc non Af Amer: 72 mL/min — ABNORMAL LOW (ref >90)
Glucose, Bld: 65 mg/dL — ABNORMAL LOW (ref 70–99)
Potassium: 3.7 mEq/L (ref 3.5–5.1)
Sodium: 138 mEq/L (ref 135–145)

## 2012-02-23 LAB — ETHANOL: Alcohol, Ethyl (B): <11 mg/dL (ref 0–11)

## 2012-02-23 LAB — CBC WITH DIFFERENTIAL/PLATELET
Eosinophils Absolute: 0.1 10*3/uL (ref 0.0–0.7)
Lymphocytes Relative: 30 % (ref 12–46)
Lymphs Abs: 2.1 10*3/uL (ref 0.7–4.0)
MCH: 31.1 pg (ref 26.0–34.0)
Neutro Abs: 4.3 10*3/uL (ref 1.7–7.7)
Neutrophils Relative %: 62 % (ref 43–77)
Platelets: 233 10*3/uL (ref 150–400)
RBC: 4.34 MIL/uL (ref 4.22–5.81)
WBC: 7 10*3/uL (ref 4.0–10.5)

## 2012-02-23 MED ORDER — DEXTROSE 50 % IV SOLN
INTRAVENOUS | Status: AC
  Filled 2012-02-23: qty 50

## 2012-02-23 MED ORDER — LORAZEPAM 2 MG/ML IJ SOLN
1.0000 mg | Freq: Once | INTRAMUSCULAR | Status: AC
  Administered 2012-02-23: 1 mg via INTRAVENOUS
  Filled 2012-02-23: qty 1

## 2012-02-23 MED ORDER — LORAZEPAM 2 MG/ML IJ SOLN
1.0000 mg | Freq: Once | INTRAMUSCULAR | Status: AC
  Administered 2012-02-23: 1 mg via INTRAVENOUS

## 2012-02-23 MED ORDER — ONDANSETRON HCL 4 MG/2ML IJ SOLN
INTRAMUSCULAR | Status: AC
  Administered 2012-02-23: 4 mg
  Filled 2012-02-23: qty 2

## 2012-02-23 MED ORDER — ONDANSETRON HCL 4 MG/2ML IJ SOLN
4.0000 mg | Freq: Once | INTRAMUSCULAR | Status: AC
  Administered 2012-02-23: 4 mg via INTRAVENOUS
  Filled 2012-02-23: qty 2

## 2012-02-23 NOTE — ED Notes (Signed)
Pt in via EMS, per EMS- pt in after syncopal episode, states his gf left him over the weekend and he has not eaten since, tonight he walked outside to get something out of his car and got dizzy and slid down beside car, pt last recalls EMS waking him up, neighbors called and stated patient was unresponsive, upon EMS arrival pt was hugging a neighbors leg and crying. Pt alert and oriented, EKG WNL per EMS. IV started PTA, CBG 53 and patient was given oral glucose.

## 2012-02-23 NOTE — ED Provider Notes (Signed)
History     CSN: [id number]  Arrival date & time 02/23/12  2200   First MD Initiated Contact with Patient 02/23/12 2206      Chief Complaint  Patient presents with  . Loss of Consciousness    (Consider location/radiation/quality/duration/timing/severity/associated sxs/prior treatment) HPI Comments: This is a 46 year old male, who presents to the emergency department with chief complaint of LOC. Patient states that he went out to his car earlier this evening, and became dizzy. He reports that he slid down to the ground next to his car.  His blood sugar was 53, when the EMS arrived.  He reports that he has tried eating, but feels nauseated every time he eats.  He has seen his counselors today, and denies SI/HI.  The history is provided by the patient. No language interpreter was used.    Past Medical History  Diagnosis Date  . Thyroid disease   . Anxiety and depression   . Hypothyroidism     History reviewed. No pertinent past surgical history.  History reviewed. No pertinent family history.  History  Substance Use Topics  . Smoking status: Never Smoker   . Smokeless tobacco: Not on file  . Alcohol Use: Yes     occasional/social      Review of Systems  Neurological: Positive for dizziness.  All other systems reviewed and are negative.    Allergies  Penicillins and Sulfonamide derivatives  Home Medications   Current Outpatient Rx  Name Route Sig Dispense Refill  . EXCEDRIN MIGRAINE PO Oral Take 2 tablets by mouth once as needed. For migraine     . BUPROPION HCL ER (XL) 150 MG PO TB24 Oral Take 150 mg by mouth daily.      . BUPROPION HCL ER (XL) 300 MG PO TB24 Oral Take 300 mg by mouth daily.      Marland Kitchen CLONAZEPAM 2 MG PO TABS Oral Take 2 mg by mouth 3 (three) times daily.     . CYCLOBENZAPRINE HCL 10 MG PO TABS Oral Take 10 mg by mouth at bedtime.     . FENTANYL 25 MCG/HR TD PT72 Transdermal Place 1 patch onto the skin every 3 (three) days.      Marland Kitchen  HYDROCODONE-ACETAMINOPHEN 10-325 MG PO TABS Oral Take 1-2 tablets by mouth every 6 (six) hours as needed. For pain     . LEVOTHYROXINE SODIUM 112 MCG PO TABS Oral Take 112 mcg by mouth daily.      Marland Kitchen MIRTAZAPINE 30 MG PO TABS Oral Take 30 mg by mouth at bedtime.      . MULTI-VITAMIN/MINERALS PO TABS Oral Take 1 tablet by mouth daily.      Marland Kitchen ONDANSETRON HCL 8 MG PO TABS Oral Take 8 mg by mouth once as needed. For nausea    . SUMATRIPTAN-NAPROXEN SODIUM 85-500 MG PO TABS Oral Take 1 tablet by mouth once as needed. For migraines     . TOPIRAMATE 100 MG PO TABS Oral Take 100 mg by mouth 2 (two) times daily.     Marland Kitchen ZOLPIDEM TARTRATE ER 12.5 MG PO TBCR Oral Take 12.5 mg by mouth at bedtime as needed. For sleep      BP 134/84  Pulse 91  Temp 97.7 F (36.5 C) (Oral)  Resp 24  SpO2 100%  Physical Exam  Nursing note and vitals reviewed. Constitutional: He is oriented to person, place, and time. He appears well-developed and well-nourished.  HENT:  Head: Normocephalic and atraumatic.  Eyes: Conjunctivae normal and  EOM are normal. Pupils are equal, round, and reactive to light.  Neck: Normal range of motion. Neck supple.  Cardiovascular: Normal rate, regular rhythm and normal heart sounds.   Pulmonary/Chest: Effort normal and breath sounds normal.  Abdominal: Soft. Bowel sounds are normal.  Musculoskeletal: Normal range of motion.  Neurological: He is alert and oriented to person, place, and time.  Skin: Skin is warm and dry.  Psychiatric:       Crying at bedside, very anxious    ED Course  Procedures (including critical care time)  Labs Reviewed  GLUCOSE, CAPILLARY - Abnormal; Notable for the following:    Glucose-Capillary 68 (*)     All other components within normal limits  GLUCOSE, CAPILLARY  CBC WITH DIFFERENTIAL  BASIC METABOLIC PANEL  ETHANOL  URINE RAPID DRUG SCREEN (HOSP PERFORMED)  TROPONIN I  D-DIMER, QUANTITATIVE   Results for orders placed during the hospital  encounter of 02/23/12  GLUCOSE, CAPILLARY      Component Value Range   Glucose-Capillary 73  70 - 99 mg/dL  CBC WITH DIFFERENTIAL      Component Value Range   WBC 7.0  4.0 - 10.5 K/uL   RBC 4.34  4.22 - 5.81 MIL/uL   Hemoglobin 13.5  13.0 - 17.0 g/dL   HCT 14.7  82.9 - 56.2 %   MCV 90.1  78.0 - 100.0 fL   MCH 31.1  26.0 - 34.0 pg   MCHC 34.5  30.0 - 36.0 g/dL   RDW 13.0  86.5 - 78.4 %   Platelets 233  150 - 400 K/uL   Neutrophils Relative 62  43 - 77 %   Neutro Abs 4.3  1.7 - 7.7 K/uL   Lymphocytes Relative 30  12 - 46 %   Lymphs Abs 2.1  0.7 - 4.0 K/uL   Monocytes Relative 7  3 - 12 %   Monocytes Absolute 0.5  0.1 - 1.0 K/uL   Eosinophils Relative 1  0 - 5 %   Eosinophils Absolute 0.1  0.0 - 0.7 K/uL   Basophils Relative 0  0 - 1 %   Basophils Absolute 0.0  0.0 - 0.1 K/uL  BASIC METABOLIC PANEL      Component Value Range   Sodium 138  135 - 145 mEq/L   Potassium 3.7  3.5 - 5.1 mEq/L   Chloride 101  96 - 112 mEq/L   CO2 19  19 - 32 mEq/L   Glucose, Bld 65 (*) 70 - 99 mg/dL   BUN 14  6 - 23 mg/dL   Creatinine, Ser 6.96  0.50 - 1.35 mg/dL   Calcium 8.9  8.4 - 29.5 mg/dL   GFR calc non Af Amer 72 (*) >90 mL/min   GFR calc Af Amer 84 (*) >90 mL/min  GLUCOSE, CAPILLARY      Component Value Range   Glucose-Capillary 68 (*) 70 - 99 mg/dL  ETHANOL      Component Value Range   Alcohol, Ethyl (B) <11  0 - 11 mg/dL  TROPONIN I      Component Value Range   Troponin I <0.30  <0.30 ng/mL  D-DIMER, QUANTITATIVE      Component Value Range   D-Dimer, Quant <0.27  0.00 - 0.48 ug/mL-FEU   No results found.     No diagnosis found.    MDM  This is a 46 year old male with anxiety, nausea, and vomiting.  I have discussed this patient with Dr. Richrd Prime.  I am going to move the patient to the CDU and place him on Dehydration Protocol.  I am also going to give the patient some D 5.        Roxy Horseman, PA-C 02/24/12 [phone]

## 2012-02-24 LAB — GLUCOSE, CAPILLARY: Glucose-Capillary: 216 mg/dL — ABNORMAL HIGH (ref 70–99)

## 2012-02-24 LAB — TROPONIN I: Troponin I: <0.3 ng/mL (ref <0.30)

## 2012-02-24 LAB — D-DIMER, QUANTITATIVE: D-Dimer, Quant: <0.27 ug/mL-FEU (ref 0.00–0.48)

## 2012-02-24 LAB — RAPID URINE DRUG SCREEN, HOSP PERFORMED
Amphetamines: NOT DETECTED
Opiates: NOT DETECTED

## 2012-02-24 MED ORDER — ONDANSETRON HCL 4 MG PO TABS: 4.0000 mg | ORAL_TABLET | Freq: Four times a day (QID) | ORAL | Status: DC

## 2012-02-24 MED ORDER — LORAZEPAM 2 MG/ML IJ SOLN
0.5000 mg | Freq: Once | INTRAMUSCULAR | Status: AC
  Administered 2012-02-24: 0.5 mg via INTRAVENOUS
  Filled 2012-02-24: qty 1

## 2012-02-24 MED ORDER — LORAZEPAM 1 MG PO TABS: 1.0000 mg | ORAL_TABLET | Freq: Three times a day (TID) | ORAL | Status: DC | PRN

## 2012-02-24 MED ORDER — DEXTROSE 5 % IV BOLUS
1000.0000 mL | Freq: Once | INTRAVENOUS | Status: AC
  Administered 2012-02-24: 1000 mL via INTRAVENOUS

## 2012-02-24 MED ORDER — MIRTAZAPINE 30 MG PO TABS: 30.0000 mg | ORAL_TABLET | Freq: Every day | ORAL | Status: DC

## 2012-02-24 MED ORDER — SODIUM CHLORIDE 0.9 % IV BOLUS (SEPSIS)
1000.0000 mL | Freq: Once | INTRAVENOUS | Status: AC
  Administered 2012-02-24: 1000 mL via INTRAVENOUS

## 2012-02-24 MED ORDER — ZOLPIDEM TARTRATE 5 MG PO TABS: 5.0000 mg | ORAL_TABLET | Freq: Every evening | ORAL | Status: DC | PRN

## 2012-02-24 MED ORDER — ONDANSETRON HCL 4 MG/2ML IJ SOLN
4.0000 mg | Freq: Once | INTRAMUSCULAR | Status: AC
  Administered 2012-02-24: 4 mg via INTRAVENOUS

## 2012-02-24 MED ORDER — MIRTAZAPINE 30 MG PO TABS
30.0000 mg | ORAL_TABLET | Freq: Every day | ORAL | Status: DC
  Administered 2012-02-24: 30 mg via ORAL
  Filled 2012-02-24: qty 1

## 2012-02-24 MED ORDER — LORAZEPAM 2 MG/ML IJ SOLN
2.0000 mg | Freq: Once | INTRAMUSCULAR | Status: AC
  Administered 2012-02-24: 2 mg via INTRAVENOUS
  Filled 2012-02-24: qty 1

## 2012-02-24 MED ORDER — ONDANSETRON HCL 4 MG/2ML IJ SOLN
4.0000 mg | Freq: Four times a day (QID) | INTRAMUSCULAR | Status: DC | PRN
  Filled 2012-02-24: qty 2

## 2012-02-24 MED ORDER — PROMETHAZINE HCL 25 MG RE SUPP: 25.0000 mg | Freq: Four times a day (QID) | RECTAL | Status: DC | PRN

## 2012-02-24 NOTE — ED Notes (Signed)
Pt ambulated to restroom with stand by assist.  

## 2012-02-24 NOTE — ED Notes (Signed)
Pt. Is resting comfortablly, ate approximately 20 % of breakfast

## 2012-02-24 NOTE — Progress Notes (Signed)
Utilization review completed.  
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
## 2012-02-24 NOTE — ED Provider Notes (Signed)
7:30 - patient sleeping, normal vitals on monitor. Will re-evaluate.  9:20 - Patient awake. Ambulated to bathroom and felt weak and dizzy. Mildly nauseous. He has had one liter of fluids, will give second. Zofran ordered. Breakfast tray at bedside. Encouraged fluids. Will continue to monitor.  1:00 - patient is feeling better. Up to bathroom and no dizziness. He has tolerated solids and liquids. He reports he is comfortable with discharge home.   Rodena Medin, PA-C 02/24/12 1305
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
## 2012-03-02 NOTE — ED Provider Notes (Signed)
Medical screening examination/treatment/procedure(s) were performed by non-physician practitioner and as supervising physician I was immediately available for consultation/collaboration.  Olivia Mackie, MD 03/02/12 2110

## 2012-11-10 ENCOUNTER — Encounter: Payer: Self-pay | Admitting: Internal Medicine

## 2012-11-11 ENCOUNTER — Emergency Department (HOSPITAL_COMMUNITY)
Admission: EM | Admit: 2012-11-11 | Discharge: 2012-11-12 | Disposition: A | Discharge status: Home / Self Care | Payer: BC Managed Care – PPO | Attending: Emergency Medicine | Admitting: Emergency Medicine

## 2012-11-11 ENCOUNTER — Encounter (HOSPITAL_COMMUNITY): Payer: Self-pay | Admitting: *Deleted

## 2012-11-11 DIAGNOSIS — F329 Major depressive disorder, single episode, unspecified: Secondary | ICD-10-CM | POA: Insufficient documentation

## 2012-11-11 DIAGNOSIS — F3289 Other specified depressive episodes: Secondary | ICD-10-CM | POA: Insufficient documentation

## 2012-11-11 DIAGNOSIS — IMO0001 Reserved for inherently not codable concepts without codable children: Secondary | ICD-10-CM | POA: Insufficient documentation

## 2012-11-11 DIAGNOSIS — R1110 Vomiting, unspecified: Secondary | ICD-10-CM

## 2012-11-11 DIAGNOSIS — E079 Disorder of thyroid, unspecified: Secondary | ICD-10-CM | POA: Insufficient documentation

## 2012-11-11 DIAGNOSIS — Z79899 Other long term (current) drug therapy: Secondary | ICD-10-CM | POA: Insufficient documentation

## 2012-11-11 DIAGNOSIS — Z87891 Personal history of nicotine dependence: Secondary | ICD-10-CM | POA: Insufficient documentation

## 2012-11-11 DIAGNOSIS — E039 Hypothyroidism, unspecified: Secondary | ICD-10-CM | POA: Insufficient documentation

## 2012-11-11 DIAGNOSIS — F411 Generalized anxiety disorder: Secondary | ICD-10-CM | POA: Insufficient documentation

## 2012-11-11 DIAGNOSIS — R51 Headache: Secondary | ICD-10-CM | POA: Insufficient documentation

## 2012-11-11 DIAGNOSIS — R109 Unspecified abdominal pain: Secondary | ICD-10-CM | POA: Insufficient documentation

## 2012-11-11 DIAGNOSIS — K2970 Gastritis, unspecified, without bleeding: Secondary | ICD-10-CM | POA: Insufficient documentation

## 2012-11-11 LAB — URINALYSIS, ROUTINE W REFLEX MICROSCOPIC
Bilirubin Urine: NEGATIVE
Ketones, ur: NEGATIVE mg/dL
Leukocytes, UA: NEGATIVE
Nitrite: NEGATIVE
Urobilinogen, UA: 0.2 mg/dL (ref 0.0–1.0)
pH: 5.5 (ref 5.0–8.0)

## 2012-11-11 LAB — CBC WITH DIFFERENTIAL/PLATELET
Basophils Relative: 1 % (ref 0–1)
Eosinophils Absolute: 0.1 10*3/uL (ref 0.0–0.7)
HCT: 40.9 % (ref 39.0–52.0)
Hemoglobin: 14 g/dL (ref 13.0–17.0)
Lymphs Abs: 1.6 10*3/uL (ref 0.7–4.0)
MCH: 31 pg (ref 26.0–34.0)
MCHC: 34.2 g/dL (ref 30.0–36.0)
MCV: 90.5 fL (ref 78.0–100.0)
Monocytes Absolute: 0.4 10*3/uL (ref 0.1–1.0)
Monocytes Relative: 11 % (ref 3–12)
RBC: 4.52 MIL/uL (ref 4.22–5.81)

## 2012-11-11 LAB — COMPREHENSIVE METABOLIC PANEL
Albumin: 3.6 g/dL (ref 3.5–5.2)
Alkaline Phosphatase: 88 U/L (ref 39–117)
BUN: 19 mg/dL (ref 6–23)
Chloride: 104 mEq/L (ref 96–112)
Creatinine, Ser: 1.18 mg/dL (ref 0.50–1.35)
GFR calc Af Amer: 85 mL/min — ABNORMAL LOW (ref >90)
Glucose, Bld: 75 mg/dL (ref 70–99)
Total Bilirubin: 0.2 mg/dL — ABNORMAL LOW (ref 0.3–1.2)
Total Protein: 6.8 g/dL (ref 6.0–8.3)

## 2012-11-11 LAB — LIPASE, BLOOD: Lipase: 23 U/L (ref 11–59)

## 2012-11-11 MED ORDER — FAMOTIDINE IN NACL 20-0.9 MG/50ML-% IV SOLN
20.0000 mg | Freq: Once | INTRAVENOUS | Status: AC
  Administered 2012-11-11: 20 mg via INTRAVENOUS
  Filled 2012-11-11: qty 50

## 2012-11-11 MED ORDER — SODIUM CHLORIDE 0.9 % IV BOLUS (SEPSIS)
1000.0000 mL | Freq: Once | INTRAVENOUS | Status: AC
  Administered 2012-11-12: 1000 mL via INTRAVENOUS

## 2012-11-11 MED ORDER — ONDANSETRON HCL 4 MG/2ML IJ SOLN
4.0000 mg | Freq: Once | INTRAMUSCULAR | Status: AC
  Administered 2012-11-11: 4 mg via INTRAVENOUS
  Filled 2012-11-11: qty 2

## 2012-11-11 MED ORDER — SODIUM CHLORIDE 0.9 % IV BOLUS (SEPSIS)
1000.0000 mL | Freq: Once | INTRAVENOUS | Status: AC
Start: 2012-11-11 — End: 2012-11-12
  Administered 2012-11-11: 1000 mL via INTRAVENOUS

## 2012-11-11 MED ORDER — LORAZEPAM 2 MG/ML IJ SOLN
1.0000 mg | Freq: Once | INTRAMUSCULAR | Status: AC
  Administered 2012-11-12: 1 mg via INTRAVENOUS
  Filled 2012-11-11: qty 1

## 2012-11-11 MED ORDER — HYDROMORPHONE HCL PF 1 MG/ML IJ SOLN
1.0000 mg | Freq: Once | INTRAMUSCULAR | Status: AC
  Administered 2012-11-12: 1 mg via INTRAVENOUS
  Filled 2012-11-11: qty 1

## 2012-11-11 MED ORDER — MORPHINE SULFATE 4 MG/ML IJ SOLN
4.0000 mg | Freq: Once | INTRAMUSCULAR | Status: AC
  Administered 2012-11-11: 4 mg via INTRAVENOUS
  Filled 2012-11-11: qty 1

## 2012-11-11 NOTE — ED Notes (Addendum)
Pt states vomiting on and off since February. Saw PCP on Wednesday. Medications not helping. Unable to keep food and drink down.

## 2012-11-11 NOTE — ED Notes (Signed)
Pt requesting additional dose of pain medication.

## 2012-11-11 NOTE — ED Provider Notes (Addendum)
History    CSN: [id number] Arrival date & time 11/11/12  2021  First MD Initiated Contact with Patient 11/11/12 2128     Chief Complaint  Patient presents with  . Emesis   (Consider location/radiation/quality/duration/timing/severity/associated sxs/prior Treatment) Patient is a 47 y.o. male presenting with vomiting. The history is provided by the patient.  Emesis Severity:  Severe Duration:  4 days Timing:  Constant Number of daily episodes:  Numerous Quality:  Stomach contents Feeding tolerance: Neither. Progression:  Unchanged Chronicity:  Recurrent Relieved by:  Nothing Exacerbated by: Eating or drinking. Ineffective treatments:  Antiemetics Associated symptoms: abdominal pain, headaches and myalgias   Associated symptoms: no arthralgias, no chills, no cough, no diarrhea, no fever, no sore throat and no URI   Abdominal pain:    Location:  Epigastric and periumbilical   Quality:  Aching, bloating and gnawing   Severity:  Severe   Onset quality:  Gradual   Timing:  Intermittent   Progression:  Worsening   Chronicity:  Recurrent Risk factors: prior abdominal surgery   Risk factors: no alcohol use, no diabetes and no sick contacts    Past Medical History  Diagnosis Date  . Thyroid disease   . Anxiety and depression   . Hypothyroidism    Past Surgical History  Procedure Laterality Date  . Cholecystectomy    . Appendectomy     History reviewed. No pertinent family history. History  Substance Use Topics  . Smoking status: Former Smoker    Quit date: 09/24/2012  . Smokeless tobacco: Not on file  . Alcohol Use: Yes     Comment: occasional/social    Review of Systems  Constitutional: Negative for chills.  HENT: Negative for sore throat.   Gastrointestinal: Positive for vomiting and abdominal pain. Negative for diarrhea.  Musculoskeletal: Positive for myalgias. Negative for arthralgias.  Neurological: Positive for headaches.  All other systems reviewed and  are negative.    Allergies  Penicillins and Sulfonamide derivatives  Home Medications   Current Outpatient Rx  Name  Route  Sig  Dispense  Refill  . Aspirin-Acetaminophen-Caffeine (EXCEDRIN MIGRAINE PO)   Oral   Take 2 tablets by mouth once as needed (migraines).          Marland Kitchen buPROPion (WELLBUTRIN XL) 300 MG 24 hr tablet   Oral   Take 300 mg by mouth daily.           . clonazePAM (KLONOPIN) 2 MG tablet   Oral   Take 2 mg by mouth 3 (three) times daily.          . cyclobenzaprine (FLEXERIL) 10 MG tablet   Oral   Take 10 mg by mouth at bedtime.          Marland Kitchen levothyroxine (SYNTHROID, LEVOTHROID) 112 MCG tablet   Oral   Take 112 mcg by mouth daily.           . mirtazapine (REMERON) 30 MG tablet   Oral   Take 30 mg by mouth at bedtime.           . ondansetron (ZOFRAN) 4 MG tablet   Oral   Take 4 mg by mouth every 6 (six) hours.         . pantoprazole (PROTONIX) 40 MG tablet   Oral   Take 40 mg by mouth daily.         . promethazine (PHENERGAN) 25 MG tablet   Oral   Take 25 mg by mouth every 6 (six)  hours as needed for nausea.         . SUMAtriptan (IMITREX) 100 MG tablet   Oral   Take 100 mg by mouth every 2 (two) hours as needed for migraine.         . topiramate (TOPAMAX) 100 MG tablet   Oral   Take 100 mg by mouth 2 (two) times daily.          Marland Kitchen zolpidem (AMBIEN CR) 12.5 MG CR tablet   Oral   Take 12.5 mg by mouth at bedtime as needed. For sleep          BP 116/81  Pulse 63  Temp(Src) 98 F (36.7 C) (Oral)  Resp 22  SpO2 100% Physical Exam  Nursing note and vitals reviewed. Constitutional: He is oriented to person, place, and time. He appears well-developed and well-nourished. No distress.  HENT:  Head: Normocephalic and atraumatic.  Mouth/Throat: Oropharynx is clear and moist. Mucous membranes are dry.  Eyes: Conjunctivae and EOM are normal. Pupils are equal, round, and reactive to light.  Neck: Normal range of motion. Neck  supple.  Cardiovascular: Normal rate, regular rhythm and intact distal pulses.   No murmur heard. Pulmonary/Chest: Effort normal and breath sounds normal. No respiratory distress. He has no wheezes. He has no rales.  Abdominal: Soft. He exhibits no distension. There is tenderness in the epigastric area. There is no rebound, no guarding and no CVA tenderness.  Musculoskeletal: Normal range of motion. He exhibits no edema and no tenderness.  Neurological: He is alert and oriented to person, place, and time.  Skin: Skin is warm and dry. No rash noted. No erythema.  Psychiatric: His behavior is normal. His affect is blunt.    ED Course  Procedures (including critical care time) Labs Reviewed  CBC WITH DIFFERENTIAL - Abnormal; Notable for the following:    WBC 3.4 (*)    Neutrophils Relative % 38 (*)    Neutro Abs 1.3 (*)    Lymphocytes Relative 49 (*)    All other components within normal limits  COMPREHENSIVE METABOLIC PANEL - Abnormal; Notable for the following:    AST 46 (*)    ALT 60 (*)    Total Bilirubin 0.2 (*)    GFR calc non Af Amer 73 (*)    GFR calc Af Amer 85 (*)    All other components within normal limits  LIPASE, BLOOD  URINALYSIS, ROUTINE W REFLEX MICROSCOPIC  CG4 I-STAT (LACTIC ACID)   No results found. 1. Gastritis   2. Vomiting     MDM   Patient with worsening upper abdominal pain and vomiting for the last several months that is intermittent but has been severe her last 3-4 days and unable to hold anything down. He's saw his PCP yesterday and was put on Phenergan which he has taken but every time he attempts to eat he vomits. He states he talked to the on-call doctor today who recommended he come here for sx continue.  Patient states he's been under a lot of stress after his partner left her after 13 years, his mother died and he is very depressed. He has lost 25 pounds without trying but denies excessive NSAID or alcohol use. No history of diabetes. Prior  appendectomy and cholecystectomy. On exam patient has epigastric pain appears dehydrated but otherwise hemodynamically stable. Labs are insignificant except for mild elevation of LFTs normal lactic acid and a negative lipase. He'll most likely patient is having gastritis. Given IV  fluids, Pepcid, Zofran and we'll attempt by mouth challenge. Feel that he will most likely need followup with GI if symptoms have been ongoing for some time.  11:57 PM Labs wnl.  Pt still c/o of nausea and pain.  Just started protonix yesterday and encouraged him to continue this.  Will also give GI f/u.  Second round of meds ordered and will po challenge.  Gwyneth Sprout, MD 11/11/12 4098  Gwyneth Sprout, MD 11/12/12 1191

## 2012-11-12 MED ORDER — HYDROMORPHONE HCL PF 1 MG/ML IJ SOLN
1.0000 mg | Freq: Once | INTRAMUSCULAR | Status: AC
  Administered 2012-11-12: 1 mg via INTRAVENOUS
  Filled 2012-11-12: qty 1

## 2012-11-12 MED ORDER — SUCRALFATE 1 GM/10ML PO SUSP: 1.0000 g | Freq: Four times a day (QID) | ORAL | Status: DC

## 2012-11-12 MED ORDER — ONDANSETRON HCL 4 MG PO TABS: 4.0000 mg | ORAL_TABLET | Freq: Four times a day (QID) | ORAL | Status: DC

## 2012-11-13 ENCOUNTER — Encounter: Payer: Self-pay | Admitting: Nurse Practitioner

## 2012-11-15 ENCOUNTER — Ambulatory Visit (INDEPENDENT_AMBULATORY_CARE_PROVIDER_SITE_OTHER): Payer: BC Managed Care – PPO | Admitting: Nurse Practitioner

## 2012-11-15 ENCOUNTER — Encounter: Payer: Self-pay | Admitting: Gastroenterology

## 2012-11-15 ENCOUNTER — Encounter (HOSPITAL_COMMUNITY): Payer: Self-pay | Admitting: Emergency Medicine

## 2012-11-15 ENCOUNTER — Encounter: Payer: Self-pay | Admitting: Nurse Practitioner

## 2012-11-15 ENCOUNTER — Observation Stay (HOSPITAL_COMMUNITY)
Admission: EM | Admit: 2012-11-15 | Discharge: 2012-11-16 | Disposition: A | Discharge status: Home / Self Care | Payer: BC Managed Care – PPO | Attending: Internal Medicine | Admitting: Internal Medicine

## 2012-11-15 VITALS — BP 100/62 | HR 88 | Ht 72.0 in | Wt 207.2 lb

## 2012-11-15 DIAGNOSIS — Z79899 Other long term (current) drug therapy: Secondary | ICD-10-CM | POA: Insufficient documentation

## 2012-11-15 DIAGNOSIS — G8929 Other chronic pain: Secondary | ICD-10-CM | POA: Insufficient documentation

## 2012-11-15 DIAGNOSIS — R079 Chest pain, unspecified: Secondary | ICD-10-CM

## 2012-11-15 DIAGNOSIS — R634 Abnormal weight loss: Secondary | ICD-10-CM | POA: Insufficient documentation

## 2012-11-15 DIAGNOSIS — K2990 Gastroduodenitis, unspecified, without bleeding: Secondary | ICD-10-CM | POA: Insufficient documentation

## 2012-11-15 DIAGNOSIS — K2970 Gastritis, unspecified, without bleeding: Secondary | ICD-10-CM | POA: Insufficient documentation

## 2012-11-15 DIAGNOSIS — F329 Major depressive disorder, single episode, unspecified: Secondary | ICD-10-CM | POA: Insufficient documentation

## 2012-11-15 DIAGNOSIS — R112 Nausea with vomiting, unspecified: Secondary | ICD-10-CM

## 2012-11-15 DIAGNOSIS — R0789 Other chest pain: Principal | ICD-10-CM | POA: Insufficient documentation

## 2012-11-15 HISTORY — DX: Gastric ulcer, unspecified as acute or chronic, without hemorrhage or perforation: K25.9

## 2012-11-15 LAB — COMPREHENSIVE METABOLIC PANEL
ALT: 43 U/L (ref 0–53)
AST: 25 U/L (ref 0–37)
Albumin: 3.7 g/dL (ref 3.5–5.2)
Alkaline Phosphatase: 131 U/L — ABNORMAL HIGH (ref 39–117)
Calcium: 9.4 mg/dL (ref 8.4–10.5)
GFR calc non Af Amer: 72 mL/min — ABNORMAL LOW (ref >90)
Glucose, Bld: 97 mg/dL (ref 70–99)
Potassium: 3.7 mEq/L (ref 3.5–5.1)
Sodium: 140 mEq/L (ref 135–145)
Total Protein: 7 g/dL (ref 6.0–8.3)

## 2012-11-15 LAB — CBC WITH DIFFERENTIAL/PLATELET
Basophils Absolute: 0 10*3/uL (ref 0.0–0.1)
Eosinophils Absolute: 0.1 10*3/uL (ref 0.0–0.7)
Eosinophils Relative: 2 % (ref 0–5)
Lymphs Abs: 2.5 10*3/uL (ref 0.7–4.0)
MCH: 31.5 pg (ref 26.0–34.0)
MCHC: 35 g/dL (ref 30.0–36.0)
MCV: 90 fL (ref 78.0–100.0)
Monocytes Absolute: 0.3 10*3/uL (ref 0.1–1.0)
Neutrophils Relative %: 38 % — ABNORMAL LOW (ref 43–77)
Platelets: 246 10*3/uL (ref 150–400)
RBC: 4.48 MIL/uL (ref 4.22–5.81)
RDW: 13.4 % (ref 11.5–15.5)

## 2012-11-15 LAB — LIPASE, BLOOD: Lipase: 23 U/L (ref 11–59)

## 2012-11-15 LAB — POCT I-STAT TROPONIN I: Troponin i, poc: 0.01 ng/mL (ref 0.00–0.08)

## 2012-11-15 NOTE — Patient Instructions (Addendum)
You have been scheduled for an endoscopy with propofol. Please follow written instructions given to you at your visit today. If you use inhalers (even only as needed), please bring them with you on the day of your procedure. Your physician has requested that you go to www.startemmi.com and enter the access code given to you at your visit today. This web site gives a general overview about your procedure. However, you should still follow specific instructions given to you by our office regarding your preparation for the procedure. CC:  Buren Kos MD

## 2012-11-15 NOTE — ED Notes (Signed)
Presents with worsening abdominal/sub-sternal pain. States he was seen last Saturday for similar Sx, given GI referral. Seen at GI today and scheduled for endoscopy next week. States that he is not able to eat and "I just dont feel right". NO numbness or tingling. NO CP radiation. Endorses SOB

## 2012-11-15 NOTE — ED Provider Notes (Signed)
History    CSN: [id number] Arrival date & time 11/15/12  2153  First MD Initiated Contact with Patient 11/15/12 2341     Chief Complaint  Patient presents with  . Chest Pain   (Consider location/radiation/quality/duration/timing/severity/associated sxs/prior Treatment) HPI This is a 47 year old male with a two-month history of postprandial emesis. Postprandial emesis has been associated with epigastric discomfort. He has been seen for this by his primary care physician and by the ED 4 days ago. The ED physician referred him to gastroenterology and he was seen by gastroenterology today. His gastroenterologist told him that they would perform an endoscopy on him late next week. He has been on Carafate and Protonix for his stomach.  He is here this evening because he developed chest pain earlier today. The pain was initially mild, coming and going. It is located left of the sternum. It is described as a squeezing or tightness. This evening about 9 PM he was walking his dog the pain worsened. It became moderate in intensity, again described as a squeezing or tightness, and was associated with mild shortness of breath but no diaphoresis or change in his baseline nausea. He states that his father had severe coronary artery disease in his 41s. He states the worst pain lasted about 15 minutes but the pain did not abate completely until he arrived at the hospital.  He further admits that he has been "out of it" today and having difficulty concentrating. He denies alcohol or drug use.  Past Medical History  Diagnosis Date  . Thyroid disease   . Anxiety and depression   . Hypothyroidism   . GERD (gastroesophageal reflux disease)    Past Surgical History  Procedure Laterality Date  . Cholecystectomy    . Appendectomy     Family History  Problem Relation Age of Onset  . Colon polyps Mother    History  Substance Use Topics  . Smoking status: Former Smoker    Quit date: 09/24/2012  .  Smokeless tobacco: Never Used  . Alcohol Use: Yes     Comment: occasional/social    Review of Systems  All other systems reviewed and are negative.    Allergies  Penicillins and Sulfonamide derivatives  Home Medications   Current Outpatient Rx  Name  Route  Sig  Dispense  Refill  . Aspirin-Acetaminophen-Caffeine (EXCEDRIN MIGRAINE PO)   Oral   Take 2 tablets by mouth daily as needed (migraines).          Marland Kitchen buPROPion (WELLBUTRIN XL) 300 MG 24 hr tablet   Oral   Take 300 mg by mouth daily.           . clonazePAM (KLONOPIN) 2 MG tablet   Oral   Take 2 mg by mouth 3 (three) times daily.          . cyclobenzaprine (FLEXERIL) 10 MG tablet   Oral   Take 10 mg by mouth at bedtime.          Marland Kitchen levothyroxine (SYNTHROID, LEVOTHROID) 112 MCG tablet   Oral   Take 112 mcg by mouth daily.           . mirtazapine (REMERON) 30 MG tablet   Oral   Take 30 mg by mouth at bedtime.           . ondansetron (ZOFRAN) 4 MG tablet   Oral   Take 4 mg by mouth every 6 (six) hours.         . pantoprazole (PROTONIX)  40 MG tablet   Oral   Take 40 mg by mouth daily.         . promethazine (PHENERGAN) 25 MG tablet   Oral   Take 25 mg by mouth every 6 (six) hours as needed for nausea.         . sucralfate (CARAFATE) 1 GM/10ML suspension   Oral   Take 10 mLs (1 g total) by mouth 4 (four) times daily.   420 mL   0   . SUMAtriptan (IMITREX) 100 MG tablet   Oral   Take 100 mg by mouth every 2 (two) hours as needed for migraine.         . topiramate (TOPAMAX) 100 MG tablet   Oral   Take 100 mg by mouth 2 (two) times daily.          Marland Kitchen zolpidem (AMBIEN CR) 12.5 MG CR tablet   Oral   Take 12.5 mg by mouth at bedtime as needed for sleep.           BP 109/82  Pulse 70  Temp(Src) 98 F (36.7 C) (Oral)  Resp 14  SpO2 100%  Physical Exam General: Well-developed, well-nourished male in no acute distress; appearance consistent with age of record HENT:  normocephalic, atraumatic Eyes: pupils equal round and reactive to light; extraocular muscles intact Neck: supple Heart: regular rate and rhythm; no murmurs, rubs or gallops Lungs: clear to auscultation bilaterally Abdomen: soft; nondistended; nontender; no masses or hepatosplenomegaly; bowel sounds present Extremities: No deformity; full range of motion; pulses normal Neurologic: Awake, alert and oriented; motor function intact in all extremities and symmetric; no facial droop Skin: Warm and dry Psychiatric: Depressed mood with congruent affect; denies suicidal ideations; occasional difficulty following train of thought    ED Course  Procedures (including critical care time)   MDM   Nursing notes and vitals signs, including pulse oximetry, reviewed.  Summary of this visit's results, reviewed by myself:  Labs:  Results for orders placed during the hospital encounter of 11/15/12 (from the past 24 hour(s))  CBC WITH DIFFERENTIAL     Status: Abnormal   Collection Time    11/15/12 10:12 PM      Result Value Range   WBC 4.6  4.0 - 10.5 K/uL   RBC 4.48  4.22 - 5.81 MIL/uL   Hemoglobin 14.1  13.0 - 17.0 g/dL   HCT 14.7  82.9 - 56.2 %   MCV 90.0  78.0 - 100.0 fL   MCH 31.5  26.0 - 34.0 pg   MCHC 35.0  30.0 - 36.0 g/dL   RDW 13.0  86.5 - 78.4 %   Platelets 246  150 - 400 K/uL   Neutrophils Relative % 38 (*) 43 - 77 %   Lymphocytes Relative 52 (*) 12 - 46 %   Monocytes Relative 7  3 - 12 %   Eosinophils Relative 2  0 - 5 %   Basophils Relative 1  0 - 1 %   Neutro Abs 1.7  1.7 - 7.7 K/uL   Lymphs Abs 2.5  0.7 - 4.0 K/uL   Monocytes Absolute 0.3  0.1 - 1.0 K/uL   Eosinophils Absolute 0.1  0.0 - 0.7 K/uL   Basophils Absolute 0.0  0.0 - 0.1 K/uL  COMPREHENSIVE METABOLIC PANEL     Status: Abnormal   Collection Time    11/15/12 10:12 PM      Result Value Range   Sodium 140  135 -  145 mEq/L   Potassium 3.7  3.5 - 5.1 mEq/L   Chloride 105  96 - 112 mEq/L   CO2 24  19 - 32 mEq/L    Glucose, Bld 97  70 - 99 mg/dL   BUN 14  6 - 23 mg/dL   Creatinine, Ser 1.61  0.50 - 1.35 mg/dL   Calcium 9.4  8.4 - 09.6 mg/dL   Total Protein 7.0  6.0 - 8.3 g/dL   Albumin 3.7  3.5 - 5.2 g/dL   AST 25  0 - 37 U/L   ALT 43  0 - 53 U/L   Alkaline Phosphatase 131 (*) 39 - 117 U/L   Total Bilirubin 0.3  0.3 - 1.2 mg/dL   GFR calc non Af Amer 72 (*) >90 mL/min   GFR calc Af Amer 83 (*) >90 mL/min  LIPASE, BLOOD     Status: None   Collection Time    11/15/12 10:12 PM      Result Value Range   Lipase 23  11 - 59 U/L  POCT I-STAT TROPONIN I     Status: None   Collection Time    11/15/12 10:41 PM      Result Value Range   Troponin i, poc 0.01  0.00 - 0.08 ng/mL   Comment 3           ETHANOL     Status: None   Collection Time    11/16/12 12:08 AM      Result Value Range   Alcohol, Ethyl (B) <11  0 - 11 mg/dL    Imaging Studies: Dg Chest 2 View  11/16/2012   *RADIOLOGY REPORT*  Clinical Data: Left-sided chest pain.  Shortness of breath. Vomiting.  Upper abdominal pain.  Symptoms for 2 months.  CHEST - 2 VIEW  Comparison: 08/15/2008  Findings: The heart size and pulmonary vascularity are normal. The lungs appear clear and expanded without focal air space disease or consolidation. No blunting of the costophrenic angles.  Mild hyperinflation.  No pneumothorax.  Mediastinal contours appear intact.  Degenerative changes in the thoracic spine.  No significant change since previous study.  Surgical clips in the upper abdomen.  IMPRESSION: No evidence of active pulmonary disease.   Original Report Authenticated By: Burman Nieves, M.D.     Date: 11/15/2012 10:08 PM  Rate: 69  Rhythm: normal sinus rhythm  QRS Axis: normal  Intervals: normal  ST/T Wave abnormalities: normal  Conduction Disutrbances: none  Narrative Interpretation: unremarkable  Comparison with previous EKG: none available  2:18 AM Patient stable. Given Zofran for nausea. Dr. Felipa Eth consulted. We will place holding orders  and Dr. Felipa Eth or Dr. Clelia Croft will see the patient later this morning.    Hanley Seamen, MD 11/16/12 [phone]

## 2012-11-15 NOTE — Progress Notes (Signed)
HPI :  Patient is a 47 year old male evaluated by Dr. Christella Hartigan for chronic nausea, vomiting, and abdominal pain back in 2010. He had been worked up in 2007 for the same symptoms.  EGD July 2007 basically normal.   Patient was ultimately found to have gallstones, mild biliary dyskinesia and underwent cholecystectomy. Gallbladder path was c/w chronic cholecystitis and cholelithiasis. Patient did well from a GI standpoint until he developed recurrent nausea and vomiting around January of this year. Symptoms have escalated over the several days. Patient saw PCP last Friday, started on Protonix. On Saturday symptoms got worse so he went to ED. Labs in ED unremarkable. He was released with a prescription for Carafate.  BMs normal except for an episode of diarrhea on Sunday .  No fever. Stools non-bloody. He complains of epigastric and chest discomfort which he attributes to the nausea and vomiting. Emesis contains food, no blood. He takes an occas Excedrin, no other NSAIDS. He has lost approximately 30 pounds since October.   Past Medical History  Diagnosis Date  . Thyroid disease   . Anxiety and depression   . Hypothyroidism   . GERD (gastroesophageal reflux disease)     Family History  Problem Relation Age of Onset  . Colon polyps Mother    History  Substance Use Topics  . Smoking status: Former Smoker    Quit date: 09/24/2012  . Smokeless tobacco: Never Used  . Alcohol Use: Yes     Comment: occasional/social   Current Outpatient Prescriptions  Medication Sig Dispense Refill  . Aspirin-Acetaminophen-Caffeine (EXCEDRIN MIGRAINE PO) Take 2 tablets by mouth once as needed (migraines).       Marland Kitchen buPROPion (WELLBUTRIN XL) 300 MG 24 hr tablet Take 300 mg by mouth daily.        . clonazePAM (KLONOPIN) 2 MG tablet Take 2 mg by mouth 3 (three) times daily.       . cyclobenzaprine (FLEXERIL) 10 MG tablet Take 10 mg by mouth at bedtime.       Marland Kitchen levothyroxine (SYNTHROID, LEVOTHROID) 112 MCG tablet Take 112  mcg by mouth daily.        . mirtazapine (REMERON) 30 MG tablet Take 30 mg by mouth at bedtime.        . ondansetron (ZOFRAN) 4 MG tablet Take 4 mg by mouth every 6 (six) hours.      . pantoprazole (PROTONIX) 40 MG tablet Take 40 mg by mouth daily.      . promethazine (PHENERGAN) 25 MG tablet Take 25 mg by mouth every 6 (six) hours as needed for nausea.      . sucralfate (CARAFATE) 1 GM/10ML suspension Take 10 mLs (1 g total) by mouth 4 (four) times daily.  420 mL  0  . SUMAtriptan (IMITREX) 100 MG tablet Take 100 mg by mouth every 2 (two) hours as needed for migraine.      . topiramate (TOPAMAX) 100 MG tablet Take 100 mg by mouth 2 (two) times daily.       Marland Kitchen zolpidem (AMBIEN CR) 12.5 MG CR tablet Take 12.5 mg by mouth at bedtime as needed. For sleep       No current facility-administered medications for this visit.   Allergies  Allergen Reactions  . Penicillins Other (See Comments)    unknown  . Sulfonamide Derivatives Hives   Review of Systems: All systems reviewed and negative except where noted in HPI.   Physical Exam: BP 100/62  Pulse 88  Ht 6' (1.829 m)  Wt 207 lb 4 oz (94.008 kg)  BMI 28.1 kg/m2 Constitutional: Pleasant,well-developed, white male in no acute distress. HEENT: Normocephalic and atraumatic. Conjunctivae are normal. No scleral icterus. Neck supple.  Cardiovascular: Normal rate, regular rhythm.  Pulmonary/chest: Effort normal and breath sounds normal. No wheezing, rales or rhonchi. Abdominal: Soft, nondistended, nontender. Bowel sounds active throughout. There are no masses palpable. No hepatomegaly. Extremities: no edema Lymphadenopathy: No cervical adenopathy noted. Neurological: Alert and oriented to person place and time. Skin: Skin is warm and dry. No rashes noted. Psychiatric: Slightly lethargic, flat affect but pleasant.    ASSESSMENT AND PLAN: 1. Recurrent nausea and vomiting, present since January but progressive over last several days. Suspect  functional but need to exclude other etiologies given the significant associated weight loss. Patient will be scheduled for EGD with propofol for further evaluation. The benefits, risks, and potential complications of EGD with possible biopsies  were discussed with the patient and he agrees to proceed. If negative, consider trial of low dose Reglan as some of his medications could be causing delayed gastric emptying.   2. Mild transaminitis (new). These should be repeated in 4-6 weeks with further workup if still abnormal.

## 2012-11-16 ENCOUNTER — Emergency Department (HOSPITAL_COMMUNITY): Payer: BC Managed Care – PPO

## 2012-11-16 ENCOUNTER — Encounter (HOSPITAL_COMMUNITY): Payer: Self-pay | Admitting: Anesthesiology

## 2012-11-16 ENCOUNTER — Observation Stay (HOSPITAL_COMMUNITY): Payer: BC Managed Care – PPO

## 2012-11-16 ENCOUNTER — Encounter (HOSPITAL_COMMUNITY): Admission: EM | Disposition: A | Discharge status: Home / Self Care | Payer: Self-pay | Source: Home / Self Care

## 2012-11-16 DIAGNOSIS — R0789 Other chest pain: Secondary | ICD-10-CM | POA: Diagnosis present

## 2012-11-16 LAB — ETHANOL: Alcohol, Ethyl (B): <11 mg/dL (ref 0–11)

## 2012-11-16 LAB — RAPID URINE DRUG SCREEN, HOSP PERFORMED
Amphetamines: NOT DETECTED
Barbiturates: NOT DETECTED
Benzodiazepines: POSITIVE — AB
Cocaine: NOT DETECTED
Opiates: NOT DETECTED
Tetrahydrocannabinol: NOT DETECTED

## 2012-11-16 LAB — CBC
MCH: 30.6 pg (ref 26.0–34.0)
MCHC: 33.6 g/dL (ref 30.0–36.0)
MCV: 91 fL (ref 78.0–100.0)
Platelets: 211 10*3/uL (ref 150–400)
RBC: 4.22 MIL/uL (ref 4.22–5.81)

## 2012-11-16 LAB — TROPONIN I: Troponin I: <0.3 ng/mL (ref <0.30)

## 2012-11-16 SURGERY — EGD (ESOPHAGOGASTRODUODENOSCOPY)
Anesthesia: Moderate Sedation

## 2012-11-16 MED ORDER — NITROGLYCERIN 0.4 MG SL SUBL: 0.4000 mg | SUBLINGUAL_TABLET | SUBLINGUAL | Status: DC | PRN

## 2012-11-16 MED ORDER — ENOXAPARIN SODIUM 40 MG/0.4ML ~~LOC~~ SOLN
40.0000 mg | SUBCUTANEOUS | Status: DC
  Administered 2012-11-16: 40 mg via SUBCUTANEOUS
  Filled 2012-11-16 (×2): qty 0.4

## 2012-11-16 MED ORDER — PANTOPRAZOLE SODIUM 40 MG PO TBEC
40.0000 mg | DELAYED_RELEASE_TABLET | Freq: Two times a day (BID) | ORAL | Status: DC
  Administered 2012-11-16 (×2): 40 mg via ORAL
  Filled 2012-11-16 (×2): qty 1

## 2012-11-16 MED ORDER — ALUM & MAG HYDROXIDE-SIMETH 200-200-20 MG/5ML PO SUSP: 30.0000 mL | Freq: Four times a day (QID) | ORAL | Status: DC | PRN

## 2012-11-16 MED ORDER — TOPIRAMATE 100 MG PO TABS
100.0000 mg | ORAL_TABLET | Freq: Two times a day (BID) | ORAL | Status: DC
Start: 2012-11-16 — End: 2012-11-16
  Administered 2012-11-16: 100 mg via ORAL
  Filled 2012-11-16 (×3): qty 1

## 2012-11-16 MED ORDER — PROMETHAZINE HCL 25 MG PO TABS: 25.0000 mg | ORAL_TABLET | Freq: Four times a day (QID) | ORAL | Status: DC | PRN

## 2012-11-16 MED ORDER — LEVOTHYROXINE SODIUM 112 MCG PO TABS
112.0000 ug | ORAL_TABLET | Freq: Every day | ORAL | Status: DC
  Filled 2012-11-16: qty 1

## 2012-11-16 MED ORDER — ASPIRIN 81 MG PO CHEW
324.0000 mg | CHEWABLE_TABLET | Freq: Once | ORAL | Status: AC
  Administered 2012-11-16: 324 mg via ORAL
  Filled 2012-11-16: qty 4

## 2012-11-16 MED ORDER — SODIUM CHLORIDE 0.9 % IJ SOLN
3.0000 mL | Freq: Two times a day (BID) | INTRAMUSCULAR | Status: DC
  Administered 2012-11-16: 3 mL via INTRAVENOUS

## 2012-11-16 MED ORDER — ENOXAPARIN SODIUM 30 MG/0.3ML ~~LOC~~ SOLN: 30.0000 mg | SUBCUTANEOUS | Status: DC

## 2012-11-16 MED ORDER — SUCRALFATE 1 GM/10ML PO SUSP
1.0000 g | Freq: Four times a day (QID) | ORAL | Status: DC
  Administered 2012-11-16 (×3): 1 g via ORAL
  Filled 2012-11-16 (×5): qty 10

## 2012-11-16 MED ORDER — MORPHINE SULFATE 2 MG/ML IJ SOLN
1.0000 mg | INTRAMUSCULAR | Status: DC | PRN
  Administered 2012-11-16: 1 mg via INTRAVENOUS
  Filled 2012-11-16: qty 1

## 2012-11-16 MED ORDER — BUPROPION HCL ER (XL) 300 MG PO TB24
300.0000 mg | ORAL_TABLET | Freq: Every day | ORAL | Status: DC
  Administered 2012-11-16: 300 mg via ORAL
  Filled 2012-11-16 (×2): qty 1

## 2012-11-16 MED ORDER — ONDANSETRON HCL 4 MG/2ML IJ SOLN: 4.0000 mg | Freq: Four times a day (QID) | INTRAMUSCULAR | Status: DC | PRN

## 2012-11-16 MED ORDER — SODIUM CHLORIDE 0.9 % IV SOLN: INTRAVENOUS | Status: DC

## 2012-11-16 MED ORDER — ONDANSETRON HCL 4 MG PO TABS
8.0000 mg | ORAL_TABLET | Freq: Once | ORAL | Status: DC
  Filled 2012-11-16: qty 2

## 2012-11-16 MED ORDER — SUMATRIPTAN SUCCINATE 100 MG PO TABS
100.0000 mg | ORAL_TABLET | ORAL | Status: DC | PRN
  Filled 2012-11-16: qty 1

## 2012-11-16 MED ORDER — ZOLPIDEM TARTRATE 5 MG PO TABS: 5.0000 mg | ORAL_TABLET | Freq: Every evening | ORAL | Status: DC | PRN

## 2012-11-16 MED ORDER — ONDANSETRON HCL 4 MG/2ML IJ SOLN: 4.0000 mg | Freq: Three times a day (TID) | INTRAMUSCULAR | Status: DC | PRN

## 2012-11-16 MED ORDER — PANTOPRAZOLE SODIUM 40 MG PO TBEC: 40.0000 mg | DELAYED_RELEASE_TABLET | Freq: Two times a day (BID) | ORAL | Status: DC

## 2012-11-16 MED ORDER — ONDANSETRON 4 MG PO TBDP
ORAL_TABLET | ORAL | Status: AC
  Administered 2012-11-16: 8 mg
  Filled 2012-11-16: qty 2

## 2012-11-16 MED ORDER — CLONAZEPAM 1 MG PO TABS
2.0000 mg | ORAL_TABLET | Freq: Three times a day (TID) | ORAL | Status: DC
  Administered 2012-11-16 (×2): 2 mg via ORAL
  Filled 2012-11-16 (×2): qty 2

## 2012-11-16 MED ORDER — MIRTAZAPINE 30 MG PO TABS
30.0000 mg | ORAL_TABLET | Freq: Every day | ORAL | Status: DC
  Filled 2012-11-16: qty 1
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
## 2012-11-16 NOTE — Progress Notes (Signed)
INITIAL NUTRITION ASSESSMENT  DOCUMENTATION CODES Per approved criteria  -Severe  malnutrition in the context of social or environmental circumstances   INTERVENTION: Recommend oral nutrition supplements once diet is advanced to help with weight maintenance. RD to continue to follow nutrition care plan.  NUTRITION DIAGNOSIS: Inadequate oral intake related to stress + GI distress as evidenced by weight loss and oral intake.   Goal: Intake to meet >90% of estimated nutrition needs.  Monitor:  weight trends, lab trends, I/O's, PO intake, supplement tolerance  Reason for Assessment: Malnutrition Screening Tool  47 y.o. male  Admitting Dx: Atypical chest pain  ASSESSMENT: PMHx significant for HLD and major depression. Admitted with CP. Also notes significant GI issues associated with abdominal pain, reflux, and n/v x several months and significantly worsened over the past 3 weeks.  Pt with vomiting after every meal. Has not had a BM x several days. Reports approximately 35 lb wt loss in the past 8 months 2/2 separation from partner and death of mother. Followed by psych on outpatient basis. Has been admitted to mental health x 2 for suicidal ideation.  Pt to undergo GI evaluation with endoscopy this afternoon and likely to be d/c'd after this procedure.  Pt meets criteria for severe MALNUTRITION in the context of social/environmental circumstances as evidenced by 10% wt loss x 6 months and intake of <50% of estimated energy intake x at least 1 month.   Height: Ht Readings from Last 1 Encounters:  11/16/12 6' (1.829 m)    Weight: Wt Readings from Last 1 Encounters:  11/16/12 209 lb (94.802 kg)    Ideal Body Weight: 178 lb  % Ideal Body Weight: 117%  Wt Readings from Last 10 Encounters:  11/16/12 209 lb (94.802 kg)  11/16/12 209 lb (94.802 kg)  11/15/12 207 lb 4 oz (94.008 kg)  08/05/08 229 lb (103.874 kg)  02/01/08 217 lb 4 oz (98.544 kg)    Usual Body Weight: 230  lb in Jan 2014  % Usual Body Weight: 90%  BMI:  Body mass index is 28.34 kg/(m^2). Overweight  Estimated Nutritional Needs: Kcal: 1800 - 2000 Protein: 75 - 90 g Fluid: 1.8 - 2 liters  Skin: intact  Diet Order: NPO  EDUCATION NEEDS: -No education needs identified at this time  No intake or output data in the 24 hours ending 11/16/12 1116  Last BM: PTA  Labs:   Recent Labs Lab 11/11/12 2047 11/15/12 2212 11/16/12 0812  NA 137 140  --   K 3.9 3.7  --   CL 104 105  --   CO2 22 24  --   BUN 19 14  --   CREATININE 1.18 1.20 1.25  CALCIUM 9.1 9.4  --   GLUCOSE 75 97  --     CBG (last 3)  No results found for this basename: GLUCAP,  in the last 72 hours  Scheduled Meds: . buPROPion  300 mg Oral Daily  . clonazePAM  2 mg Oral TID  . enoxaparin (LOVENOX) injection  40 mg Subcutaneous Q24H  . [START ON 11/17/2012] levothyroxine  112 mcg Oral QAC breakfast  . mirtazapine  30 mg Oral QHS  . pantoprazole  40 mg Oral BID AC  . sodium chloride  3 mL Intravenous Q12H  . sucralfate  1 g Oral QID  . topiramate  100 mg Oral BID    Continuous Infusions: . sodium chloride      Past Medical History  Diagnosis Date  . Thyroid  disease   . Anxiety and depression   . Hypothyroidism   . GERD (gastroesophageal reflux disease)   . Stomach ulcer     Past Surgical History  Procedure Laterality Date  . Cholecystectomy    . Appendectomy      Jarold Motto MS, RD, LDN Pager: [phone] After-hours pager: [phone]

## 2012-11-16 NOTE — Progress Notes (Signed)
Patient tearful when telling this RN that he would like to not have his endoscopy today and would like to wait and have as an outpatient due to needing to drive himself home today, he staqtes he has made arrangements for next Friday for transportation home after being sedated in endo, Berle Mull RN

## 2012-11-16 NOTE — Progress Notes (Signed)
Pt c/o chest pain on the left side, sharp and non-radiating, 8/10. Pain in worse with movement, no SOB, nausea, or diaphoresis.  Dr. Trula Slade paged and ordered EKG, SL NTG, and IV morphine.   Pt refused SL NTG due to potential headaches.  EKG obtained and morphine administered.

## 2012-11-16 NOTE — Progress Notes (Signed)
Trop I negative, discharge instructions and med reconc reviewed with pt, verbalizes understanding via teachback method, discharged to home to private vehicle, patient is driving home, Berle Mull RN
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
## 2012-11-16 NOTE — Progress Notes (Signed)
NT transported pt via wheelchair to pts private vehicle to home, Berle Mull RN

## 2012-11-19 NOTE — Progress Notes (Signed)
i agree with the plan in this note 

## 2012-11-24 ENCOUNTER — Encounter: Payer: Self-pay | Admitting: Gastroenterology

## 2012-11-24 ENCOUNTER — Ambulatory Visit (AMBULATORY_SURGERY_CENTER): Payer: BC Managed Care – PPO | Admitting: Gastroenterology

## 2012-11-24 VITALS — BP 116/63 | HR 66 | Temp 96.2°F | Resp 19 | Ht 72.0 in | Wt 207.0 lb

## 2012-11-24 DIAGNOSIS — K2990 Gastroduodenitis, unspecified, without bleeding: Secondary | ICD-10-CM

## 2012-11-24 DIAGNOSIS — K2960 Other gastritis without bleeding: Secondary | ICD-10-CM

## 2012-11-24 DIAGNOSIS — R112 Nausea with vomiting, unspecified: Secondary | ICD-10-CM

## 2012-11-24 MED ORDER — SODIUM CHLORIDE 0.9 % IV SOLN: 500.0000 mL | INTRAVENOUS | Status: DC

## 2012-11-24 NOTE — Op Note (Signed)
[facility] [address] [place], [address]   ENDOSCOPY PROCEDURE REPORT  PATIENT: Glenn, Santana  MR#: [id number] BIRTHDATE: [birth year] , 47  yrs. old GENDER: Male ENDOSCOPIST: Rachael Fee, MD PROCEDURE DATE:  11/24/2012 PROCEDURE:  EGD w/ biopsy ASA CLASS:     Class II INDICATIONS:  nausea, vomiting, weight loss. MEDICATIONS: MAC sedation, administered by CRNA and propofol (Diprivan) 150mg  IV TOPICAL ANESTHETIC: none  DESCRIPTION OF PROCEDURE: After the risks benefits and alternatives of the procedure were thoroughly explained, informed consent was obtained.  The Pentax Gastroscope [id number] endoscope was introduced through the mouth and advanced to the second portion of the duodenum. Without limitations.  The instrument was slowly withdrawn as the mucosa was fully examined.   There was mild non-specific gastritis distally in stomach and the pre-pyrloris was very slightly nodular.  Biopsies were taken and sent to pathology.  The examination was otherwise normal. Retroflexed views revealed no abnormalities.     The scope was then withdrawn from the patient and the procedure completed. COMPLICATIONS: There were no complications.  ENDOSCOPIC IMPRESSION: There was mild non-specific gastritis distally in stomach and the pre-pyrloris was very slightly nodular.  Biopsies were taken and sent to pathology.  The examination was otherwise normal.  RECOMMENDATIONS: Await final biopsies.  If negative for H.  pylori, will likely proceed with further testing (CT scan abd pelvis, possibly gastric emptying scan).    eSigned:  Rachael Fee, MD 11/24/2012 3:34 PM

## 2012-11-24 NOTE — Progress Notes (Signed)
Report to pacu rn, vss, bbs=clear 

## 2012-11-24 NOTE — Progress Notes (Signed)
Called to room to assist during endoscopic procedure.  Patient ID and intended procedure confirmed with present staff. Received instructions for my participation in the procedure from the performing physician. ewm 

## 2012-11-24 NOTE — Patient Instructions (Addendum)

## 2012-11-24 NOTE — Progress Notes (Signed)
Patient did not experience any of the following events: a burn prior to discharge; a fall within the facility; wrong site/side/patient/procedure/implant event; or a hospital transfer or hospital admission upon discharge from the facility. (G8907) Patient did not have preoperative order for IV antibiotic SSI prophylaxis. (G8918)  

## 2012-11-27 ENCOUNTER — Telehealth: Payer: Self-pay | Admitting: *Deleted

## 2012-11-27 NOTE — Telephone Encounter (Signed)
  Follow up Call-  Call back number 11/24/2012  Post procedure Call Back phone  # [phone]  Permission to leave phone message Yes     Patient questions:  Message left to call if necessary.

## 2013-03-01 ENCOUNTER — Other Ambulatory Visit: Payer: Self-pay

## 2013-09-11 ENCOUNTER — Encounter (HOSPITAL_COMMUNITY): Payer: Self-pay | Admitting: Emergency Medicine

## 2013-09-11 ENCOUNTER — Emergency Department (HOSPITAL_COMMUNITY)
Admission: EM | Admit: 2013-09-11 | Discharge: 2013-09-11 | Disposition: A | Discharge status: Home / Self Care | Payer: BC Managed Care – PPO | Attending: Emergency Medicine | Admitting: Emergency Medicine

## 2013-09-11 ENCOUNTER — Emergency Department (HOSPITAL_COMMUNITY): Payer: BC Managed Care – PPO

## 2013-09-11 ENCOUNTER — Ambulatory Visit (INDEPENDENT_AMBULATORY_CARE_PROVIDER_SITE_OTHER): Payer: BC Managed Care – PPO | Admitting: Surgery

## 2013-09-11 ENCOUNTER — Encounter (INDEPENDENT_AMBULATORY_CARE_PROVIDER_SITE_OTHER): Payer: Self-pay | Admitting: Surgery

## 2013-09-11 VITALS — BP 128/80 | HR 78 | Temp 97.6°F | Resp 16 | Ht 73.0 in | Wt 220.0 lb

## 2013-09-11 DIAGNOSIS — Z79899 Other long term (current) drug therapy: Secondary | ICD-10-CM | POA: Insufficient documentation

## 2013-09-11 DIAGNOSIS — Z87891 Personal history of nicotine dependence: Secondary | ICD-10-CM | POA: Insufficient documentation

## 2013-09-11 DIAGNOSIS — L729 Follicular cyst of the skin and subcutaneous tissue, unspecified: Secondary | ICD-10-CM

## 2013-09-11 DIAGNOSIS — L723 Sebaceous cyst: Secondary | ICD-10-CM | POA: Insufficient documentation

## 2013-09-11 DIAGNOSIS — Z8619 Personal history of other infectious and parasitic diseases: Secondary | ICD-10-CM | POA: Insufficient documentation

## 2013-09-11 DIAGNOSIS — N5089 Other specified disorders of the male genital organs: Secondary | ICD-10-CM

## 2013-09-11 DIAGNOSIS — N508 Other specified disorders of male genital organs: Secondary | ICD-10-CM

## 2013-09-11 DIAGNOSIS — Z8719 Personal history of other diseases of the digestive system: Secondary | ICD-10-CM | POA: Insufficient documentation

## 2013-09-11 DIAGNOSIS — F3289 Other specified depressive episodes: Secondary | ICD-10-CM | POA: Insufficient documentation

## 2013-09-11 DIAGNOSIS — F411 Generalized anxiety disorder: Secondary | ICD-10-CM | POA: Insufficient documentation

## 2013-09-11 DIAGNOSIS — E039 Hypothyroidism, unspecified: Secondary | ICD-10-CM | POA: Insufficient documentation

## 2013-09-11 DIAGNOSIS — F329 Major depressive disorder, single episode, unspecified: Secondary | ICD-10-CM | POA: Insufficient documentation

## 2013-09-11 DIAGNOSIS — Z880 Allergy status to penicillin: Secondary | ICD-10-CM | POA: Insufficient documentation

## 2013-09-11 LAB — CBC WITH DIFFERENTIAL/PLATELET
BASOS ABS: 0 10*3/uL (ref 0.0–0.1)
BASOS PCT: 1 % (ref 0–1)
Eosinophils Absolute: 0.1 10*3/uL (ref 0.0–0.7)
Eosinophils Relative: 1 % (ref 0–5)
HEMATOCRIT: 38.8 % — AB (ref 39.0–52.0)
Hemoglobin: 13.3 g/dL (ref 13.0–17.0)
LYMPHS PCT: 43 % (ref 12–46)
Lymphs Abs: 2.6 10*3/uL (ref 0.7–4.0)
MCH: 31.7 pg (ref 26.0–34.0)
MCHC: 34.3 g/dL (ref 30.0–36.0)
MCV: 92.6 fL (ref 78.0–100.0)
MONO ABS: 0.5 10*3/uL (ref 0.1–1.0)
Monocytes Relative: 8 % (ref 3–12)
NEUTROS ABS: 2.9 10*3/uL (ref 1.7–7.7)
Neutrophils Relative %: 47 % (ref 43–77)
PLATELETS: 240 10*3/uL (ref 150–400)
RBC: 4.19 MIL/uL — ABNORMAL LOW (ref 4.22–5.81)
RDW: 13 % (ref 11.5–15.5)
WBC: 6.2 10*3/uL (ref 4.0–10.5)

## 2013-09-11 LAB — BASIC METABOLIC PANEL
BUN: 23 mg/dL (ref 6–23)
CO2: 23 meq/L (ref 19–32)
CREATININE: 1.19 mg/dL (ref 0.50–1.35)
Calcium: 8.9 mg/dL (ref 8.4–10.5)
Chloride: 106 mEq/L (ref 96–112)
GFR calc non Af Amer: 72 mL/min — ABNORMAL LOW (ref >90)
GFR, EST AFRICAN AMERICAN: 83 mL/min — AB (ref >90)
Glucose, Bld: 104 mg/dL — ABNORMAL HIGH (ref 70–99)
Potassium: 3.9 mEq/L (ref 3.7–5.3)
SODIUM: 141 meq/L (ref 137–147)

## 2013-09-11 MED ORDER — HYDROMORPHONE HCL 2 MG PO TABS: 2.0000 mg | ORAL_TABLET | ORAL | Status: AC | PRN

## 2013-09-11 MED ORDER — HYDROMORPHONE HCL PF 1 MG/ML IJ SOLN
1.0000 mg | Freq: Once | INTRAMUSCULAR | Status: AC
  Administered 2013-09-11: 1 mg via INTRAVENOUS
  Filled 2013-09-11: qty 1

## 2013-09-11 MED ORDER — ONDANSETRON HCL 4 MG/2ML IJ SOLN
4.0000 mg | Freq: Once | INTRAMUSCULAR | Status: AC
Start: 2013-09-11 — End: 2013-09-11
  Administered 2013-09-11: 4 mg via INTRAVENOUS
  Filled 2013-09-11: qty 2

## 2013-09-11 NOTE — ED Notes (Signed)
Pt. reports progressing "cyst " / pain with   " burning " at right testicle onset last week , seen by his PCP ( Dr. Clelia CroftShaw ) yesterday prescribed with Clindamycin and Hydrocodone with no relief.

## 2013-09-11 NOTE — Discharge Instructions (Signed)
Continue taking your antibiotic. Your ultrasound showed a cyst. It is in a location where either a general surgeon or a urologist might be the one to take care of it.  Hydromorphone tablets What is this medicine? HYDROMORPHONE (hye droe MOR fone) is a pain reliever. It is used to treat moderate to severe pain. This medicine may be used for other purposes; ask your health care provider or pharmacist if you have questions. COMMON BRAND NAME(S): Dilaudid What should I tell my health care provider before I take this medicine? They need to know if you have any of these conditions: -brain tumor -drug abuse or addiction -head injury -heart disease -frequently drink alcohol containing drinks -kidney disease or problems going to the bathroom -liver disease -lung disease, asthma, or breathing problems -mental problems -an allergic or unusual reaction to lactose, hydromorphone, other opioid analgesics, other medicines, sulfites, foods, dyes, or preservatives -pregnant or trying to get pregnant -breast-feeding How should I use this medicine? Take this medicine by mouth with a glass of water. If the medicine upsets your stomach, take it with food or milk. Follow the directions on the prescription label. Do not take more medicine than you are told to take. Talk to your pediatrician regarding the use of this medicine in children. Special care may be needed. Overdosage: If you think you have taken too much of this medicine contact a poison control center or emergency room at once. NOTE: This medicine is only for you. Do not share this medicine with others. What if I miss a dose? If you miss a dose, take it as soon as you can. If it is almost time for your next dose, take only that dose. Do not take double or extra doses. What may interact with this medicine? -alcohol -antihistamines for allergy, cough and cold -medicines for anesthesia -medicines for depression, anxiety, or psychotic  disturbances -medicines for sleep -muscle relaxants -naltrexone -narcotic medicines (opiates) for pain -phenothiazines like chlorpromazine, mesoridazine, prochlorperazine, thioridazine -tramadol This list may not describe all possible interactions. Give your health care provider a list of all the medicines, herbs, non-prescription drugs, or dietary supplements you use. Also tell them if you smoke, drink alcohol, or use illegal drugs. Some items may interact with your medicine. What should I watch for while using this medicine? Tell your doctor or health care professional if your pain does not go away, if it gets worse, or if you have new or a different type of pain. You may develop tolerance to the medicine. Tolerance means that you will need a higher dose of the medicine for pain relief. Tolerance is normal and is expected if you take this medicine for a long time. Do not suddenly stop taking your medicine because you may develop a severe reaction. Your body becomes used to the medicine. This does NOT mean you are addicted. Addiction is a behavior related to getting and using a drug for a non-medical reason. If you have pain, you have a medical reason to take pain medicine. Your doctor will tell you how much medicine to take. If your doctor wants you to stop the medicine, the dose will be slowly lowered over time to avoid any side effects. You may get drowsy or dizzy. Do not drive, use machinery, or do anything that needs mental alertness until you know how this medicine affects you. Do not stand or sit up quickly, especially if you are an older patient. This reduces the risk of dizzy or fainting spells. Alcohol may  interfere with the effect of this medicine. Avoid alcoholic drinks. There are different types of narcotic medicines (opiates) for pain. If you take more than one type at the same time, you may have more side effects. Give your health care provider a list of all medicines you use. Your doctor  will tell you how much medicine to take. Do not take more medicine than directed. Call emergency for help if you have problems breathing. This medicine will cause constipation. Try to have a bowel movement at least every 2 to 3 days. If you do not have a bowel movement for 3 days, call your doctor or health care professional. Your mouth may get dry. Chewing sugarless gum or sucking hard candy, and drinking plenty of water may help. Contact your doctor if the problem does not go away or is severe. What side effects may I notice from receiving this medicine? Side effects that you should report to your doctor or health care professional as soon as possible: -allergic reactions like skin rash, itching or hives, swelling of the face, lips, or tongue -breathing problems -changes in vision -confusion -feeling faint or lightheaded, falls -seizures -slow or fast heartbeat -trouble passing urine or change in the amount of urine -trouble with balance, talking, walking -unusually weak or tired  Side effects that usually do not require medical attention (report to your doctor or health care professional if they continue or are bothersome): -difficulty sleeping -drowsiness -dry mouth -flushing -headache -itching -loss of appetite -nausea, vomiting This list may not describe all possible side effects. Call your doctor for medical advice about side effects. You may report side effects to FDA at 1-800-FDA-1088. Where should I keep my medicine? Keep out of the reach of children. This medicine can be abused. Keep your medicine in a safe place to protect it from theft. Do not share this medicine with anyone. Selling or giving away this medicine is dangerous and against the law. Store at room temperature between 15 and 30 degrees C (59 and 86 degrees F). Keep container tightly closed. Protect from light. This medicine may cause accidental overdose and death if it is taken by other adults, children, or pets.  Flush any unused medicine down the toilet to reduce the chance of harm. Do not use the medicine after the expiration date. NOTE: This sheet is a summary. It may not cover all possible information. If you have questions about this medicine, talk to your doctor, pharmacist, or health care provider.  2014, [employer]/Gold Standard. (2012-11-14 09:50:15)

## 2013-09-11 NOTE — ED Provider Notes (Signed)
CSN: [id number]     Arrival date & time 09/11/13  0030 History   First MD Initiated Contact with Patient 09/11/13 0134     Chief Complaint  Patient presents with  . Testicle Pain     (Consider location/radiation/quality/duration/timing/severity/associated sxs/prior Treatment) Patient is a 49 y.o. male presenting with testicular pain. The history is provided by the patient.  Testicle Pain  He just a small knot on the right side of the scrotum which presented 3 days ago. It started to get larger at 2 days ago and got significantly worse today. It has been progressively more and more painful. He currently rates pain at 8/10. Pain is worse with standing and walking. Nothing makes it any better. He denies fever, chills, sweats. He denies nausea or vomiting. He is concerned because several years ago, he had an abscess in that area which needed to be drained. He did see his PCP who gave her a prescription for hydrocodone and clindamycin and referred him to general surgery. He does not have the appointment with a surgeon yet.  Past Medical History  Diagnosis Date  . Thyroid disease   . Anxiety and depression   . Hypothyroidism   . GERD (gastroesophageal reflux disease)   . Stomach ulcer   . Allergy   . Anxiety   . Depression   . Lyme disease    Past Surgical History  Procedure Laterality Date  . Cholecystectomy    . Appendectomy     Family History  Problem Relation Age of Onset  . Colon polyps Mother   . Heart disease Father   . Diabetes Father    History  Substance Use Topics  . Smoking status: Former Smoker    Quit date: 09/24/2012  . Smokeless tobacco: Never Used  . Alcohol Use: Yes     Comment: occasional/social    Review of Systems  Genitourinary: Positive for testicular pain.  All other systems reviewed and are negative.     Allergies  Penicillins and Sulfonamide derivatives  Home Medications   Prior to Admission medications   Medication Sig Start Date End  Date Taking? Authorizing Provider  Aspirin-Acetaminophen-Caffeine (EXCEDRIN MIGRAINE PO) Take 2 tablets by mouth daily as needed (migraines).    Yes Historical Provider, MD  buPROPion (WELLBUTRIN XL) 300 MG 24 hr tablet Take 300 mg by mouth daily.     Yes Historical Provider, MD  clindamycin (CLEOCIN) 300 MG capsule Take 300 mg by mouth 3 (three) times daily. 09/10/13  Yes Historical Provider, MD  clonazePAM (KLONOPIN) 2 MG tablet Take 2 mg by mouth 3 (three) times daily.    Yes Historical Provider, MD  cyclobenzaprine (FLEXERIL) 10 MG tablet Take 10 mg by mouth at bedtime.    Yes Historical Provider, MD  HYDROcodone-acetaminophen (NORCO/VICODIN) 5-325 MG per tablet Take 1 tablet by mouth every 4 (four) hours as needed. 09/10/13  Yes Historical Provider, MD  levothyroxine (SYNTHROID, LEVOTHROID) 112 MCG tablet Take 112 mcg by mouth daily.     Yes Historical Provider, MD  mirtazapine (REMERON) 30 MG tablet Take 30 mg by mouth at bedtime.     Yes Historical Provider, MD  ondansetron (ZOFRAN) 4 MG tablet Take 4 mg by mouth every 6 (six) hours. 02/24/12  Yes Shari A Upstill, PA-C  promethazine (PHENERGAN) 25 MG tablet Take 25 mg by mouth every 6 (six) hours as needed for nausea.   Yes Historical Provider, MD  SUMAtriptan (IMITREX) 100 MG tablet Take 100 mg by mouth every 2 (  two) hours as needed for migraine.   Yes Historical Provider, MD  topiramate (TOPAMAX) 100 MG tablet Take 100 mg by mouth 2 (two) times daily.    Yes Historical Provider, MD  zolpidem (AMBIEN CR) 12.5 MG CR tablet Take 12.5 mg by mouth at bedtime as needed for sleep.    Yes Historical Provider, MD   BP 127/87  Pulse 76  Temp(Src) 97.6 F (36.4 C) (Oral)  Resp 16  Ht 6' (1.829 m)  Wt 218 lb (98.884 kg)  BMI 29.56 kg/m2  SpO2 100% Physical Exam  Nursing note and vitals reviewed.  48 year old male, resting comfortably and in no acute distress. Vital signs are normal. Oxygen saturation is 100%, which is normal. Head is  normocephalic and atraumatic. PERRLA, EOMI. Oropharynx is clear. Neck is nontender and supple without adenopathy or JVD. Back is nontender and there is no CVA tenderness. Lungs are clear without rales, wheezes, or rhonchi. Chest is nontender. Heart has regular rate and rhythm without murmur. Abdomen is soft, flat, nontender without masses or hepatosplenomegaly and peristalsis is normoactive. Genitalia: Circumcised penis. Testes descended without masses or tenderness. No hernias present. There is a small nodule which is in the subcutaneous tissue near the junction of the scrotum with the inguinal area on the right side. It is 1 x 0.5 cm. This is very tender but not fluctuant. There is no erythema the overlying skin and no inguinal adenopathy. Extremities have no cyanosis or edema, full range of motion is present. Skin is warm and dry without rash. Neurologic: Mental status is normal, cranial nerves are intact, there are no motor or sensory deficits.  ED Course  Procedures (including critical care time) Labs Review Results for orders placed during the hospital encounter of 09/11/13  CBC WITH DIFFERENTIAL      Result Value Ref Range   WBC 6.2  4.0 - 10.5 K/uL   RBC 4.19 (*) 4.22 - 5.81 MIL/uL   Hemoglobin 13.3  13.0 - 17.0 g/dL   HCT 16.138.8 (*) 09.639.0 - 04.552.0 %   MCV 92.6  78.0 - 100.0 fL   MCH 31.7  26.0 - 34.0 pg   MCHC 34.3  30.0 - 36.0 g/dL   RDW 40.913.0  81.111.5 - 91.415.5 %   Platelets 240  150 - 400 K/uL   Neutrophils Relative % 47  43 - 77 %   Neutro Abs 2.9  1.7 - 7.7 K/uL   Lymphocytes Relative 43  12 - 46 %   Lymphs Abs 2.6  0.7 - 4.0 K/uL   Monocytes Relative 8  3 - 12 %   Monocytes Absolute 0.5  0.1 - 1.0 K/uL   Eosinophils Relative 1  0 - 5 %   Eosinophils Absolute 0.1  0.0 - 0.7 K/uL   Basophils Relative 1  0 - 1 %   Basophils Absolute 0.0  0.0 - 0.1 K/uL  BASIC METABOLIC PANEL      Result Value Ref Range   Sodium 141  137 - 147 mEq/L   Potassium 3.9  3.7 - 5.3 mEq/L   Chloride  106  96 - 112 mEq/L   CO2 23  19 - 32 mEq/L   Glucose, Bld 104 (*) 70 - 99 mg/dL   BUN 23  6 - 23 mg/dL   Creatinine, Ser 7.821.19  0.50 - 1.35 mg/dL   Calcium 8.9  8.4 - 95.610.5 mg/dL   GFR calc non Af Amer 72 (*) >90 mL/min  GFR calc Af Amer 83 (*) >90 mL/min   Imaging Review [place] Pelvis Limited  09/11/2013   CLINICAL DATA:  Right scrotal soft tissue mass.  EXAM: US PELVIS LIMITED  TECHNIQUE: Ultrasound examination of the pelvic soft tissues was performed in the area of clinical concern.  COMPARISON:  DG ABD 2 VIEWS dated 11/16/2012; CT PELVIS W/CM dated 11/13/2010; US ABDOMEN COMPLETE dated 10/30/2005  FINDINGS: Within the right inguinal canal, corresponding to palpable abnormality an irregular 10 x 9 mm hypoechoic non shadowing non peristalsing mass, without superimposed vascularity.  IMPRESSION: Nonspecific 10 x 9 mm complex fluid collection right inguinal canal, this may reflect fluid insinuating from the peritoneal cavity, abscess, necrotic lymph node; this is in an area of cellulitis seen on prior CT.   Electronically Signed   By: Awilda Metroourtnay  Bloomer   On: 09/11/2013 02:51   Images viewed by me.  MDM   Final diagnoses:  Cyst of scrotum    Tender inguinal/scrotal nodule of uncertain cause. He failed to get relief with pain with hydrocodone so he'll be given hydromorphone intravenously. Ultrasound will be obtained to try to further characterize the lesion. Anticipate referral to urology.  He got good pain relief with hydromorphone. Ultrasound shows a complex cystic structure which is in an area where he had cellulitis previously. There is no evidence of active infection today with normal WBC, normal temperature, and no inflammatory changes on exam. I suspect that he would be best served by surgical removal of this structure. It is not clear whether he would be under the purview of urology or general surgery so he is referred to both. He is to continue the antibiotic prescribed by his PCP and he is  given a prescription for hydromorphone for pain.  Dione Boozeavid Madoline Bhatt, MD 09/11/13 [phone]

## 2013-09-11 NOTE — Patient Instructions (Signed)
Will refer to urology for opinion.

## 2013-09-11 NOTE — Progress Notes (Signed)
Patient ID: Glenn LatusGregory W Minchew, male   DOB: 04/09/1966, 48 y.o.   MRN: [id number]  Chief Complaint  Patient presents with  . cellulitits    HPI Glenn Santana is a 48 y.o. male.  Patient sent at the request of Dr. Preston FleetingGlick for painful right scrotal mass. Patient has history of multiple scrotal abscess he is a past and has been seen by [facility]. Pt developed painful knot in the right hemiscrotum over the weekend. Seen by primary care yesterday and clindamycin started. Seen this am in emergency room and sent to [facility].  No fever or chills.  No redness or drainage.  HPI  Past Medical History  Diagnosis Date  . Thyroid disease   . Anxiety and depression   . Hypothyroidism   . GERD (gastroesophageal reflux disease)   . Stomach ulcer   . Allergy   . Anxiety   . Depression   . Lyme disease     Past Surgical History  Procedure Laterality Date  . Cholecystectomy    . Appendectomy      Family History  Problem Relation Age of Onset  . Colon polyps Mother   . Heart disease Father   . Diabetes Father     Social History History  Substance Use Topics  . Smoking status: Former Smoker    Quit date: 09/24/2012  . Smokeless tobacco: Never Used  . Alcohol Use: Yes     Comment: occasional/social    Allergies  Allergen Reactions  . Penicillins Other (See Comments)    unknown  . Sulfonamide Derivatives Hives    Current Outpatient Prescriptions  Medication Sig Dispense Refill  . Aspirin-Acetaminophen-Caffeine (EXCEDRIN MIGRAINE PO) Take 2 tablets by mouth daily as needed (migraines).       Marland Kitchen. buPROPion (WELLBUTRIN XL) 300 MG 24 hr tablet Take 300 mg by mouth daily.        . clindamycin (CLEOCIN) 300 MG capsule Take 300 mg by mouth 3 (three) times daily.      . clonazePAM (KLONOPIN) 2 MG tablet Take 2 mg by mouth 3 (three) times daily.       . cyclobenzaprine (FLEXERIL) 10 MG tablet Take 10 mg by mouth at bedtime.       Marland Kitchen. HYDROmorphone (DILAUDID) 2 MG tablet Take 1 tablet (2 mg  total) by mouth every 4 (four) hours as needed for severe pain.  20 tablet  0  . levothyroxine (SYNTHROID, LEVOTHROID) 112 MCG tablet Take 112 mcg by mouth daily.        . mirtazapine (REMERON) 30 MG tablet Take 30 mg by mouth at bedtime.        . ondansetron (ZOFRAN) 4 MG tablet Take 4 mg by mouth every 6 (six) hours.      . promethazine (PHENERGAN) 25 MG tablet Take 25 mg by mouth every 6 (six) hours as needed for nausea.      . SUMAtriptan (IMITREX) 100 MG tablet Take 100 mg by mouth every 2 (two) hours as needed for migraine.      . topiramate (TOPAMAX) 100 MG tablet Take 100 mg by mouth 2 (two) times daily.       Marland Kitchen. zolpidem (AMBIEN CR) 12.5 MG CR tablet Take 12.5 mg by mouth at bedtime as needed for sleep.        No current facility-administered medications for this visit.    Review of Systems Review of Systems  Constitutional: Negative for fever and chills.  HENT: Negative.   Cardiovascular: Negative.  Gastrointestinal: Negative.   Genitourinary: Positive for scrotal swelling.  Neurological: Negative.   Hematological: Negative.     Blood pressure 128/80, pulse 78, temperature 97.6 F (36.4 C), resp. rate 16, height 6\' 1"  (1.854 m), weight 220 lb (99.791 kg).  Physical Exam Physical Exam  Constitutional: He is oriented to person, place, and time. He appears well-developed and well-nourished.  Abdominal: Hernia confirmed negative in the right inguinal area and confirmed negative in the left inguinal area.  Genitourinary:    Right testis shows mass and tenderness. Right testis is descended. Left testis is descended.  Lymphadenopathy:       Right: No inguinal adenopathy present.  Neurological: He is alert and oriented to person, place, and time.  Skin: Skin is warm and dry.  Psychiatric: He has a normal mood and affect. His behavior is normal. Judgment and thought content normal.    Data Reviewed CLINICAL DATA: Right scrotal soft tissue mass.  EXAM:  US PELVIS LIMITED    TECHNIQUE:  Ultrasound examination of the pelvic soft tissues was performed in  the area of clinical concern.  COMPARISON: DG ABD 2 VIEWS dated 11/16/2012; CT PELVIS W/CM dated  11/13/2010; US ABDOMEN COMPLETE dated 10/30/2005  FINDINGS:  Within the right inguinal canal, corresponding to palpable  abnormality an irregular 10 x 9 mm hypoechoic non shadowing non  peristalsing mass, without superimposed vascularity.  IMPRESSION:  Nonspecific 10 x 9 mm complex fluid collection right inguinal canal,  this may reflect fluid insinuating from the peritoneal cavity,  abscess, necrotic lymph node; this is in an area of cellulitis seen  on prior CT.  Electronically Signed  By: Awilda Metroourtnay Bloomer  On: 09/11/2013 02:51   Assessment    Mass along right spermatic cord tender without signs of redness, fluctuance or discharge.    Plan    The mass is along the spermatic cord. It is not in the inguinal canal. This is tender and down into the scrotal sac. It  does not feel separate from the spermatic cord and palpation elicits pain in the right testicle. Patient needs referral to urology for evaluation. I suspect this could be a necrotic lymph node. Given the fact that I cannot separated from the cord, any attempted removal could damage the spermatic cord and lead to testicle also the right. I discussed this with the patient today. He has seen Urology  in the past for multiple scrotal abscess. He is taking clindamycin and emergency room gave him Dilaudid.       Taylin Leder A. Freada Twersky 09/11/2013, 3:39 PM

## 2013-09-16 ENCOUNTER — Emergency Department (HOSPITAL_COMMUNITY): Payer: BC Managed Care – PPO

## 2013-09-16 ENCOUNTER — Emergency Department (HOSPITAL_COMMUNITY)
Admission: EM | Admit: 2013-09-16 | Discharge: 2013-09-16 | Disposition: A | Discharge status: Home / Self Care | Payer: BC Managed Care – PPO | Attending: Emergency Medicine | Admitting: Emergency Medicine

## 2013-09-16 ENCOUNTER — Encounter (HOSPITAL_COMMUNITY): Payer: Self-pay | Admitting: Emergency Medicine

## 2013-09-16 DIAGNOSIS — Z87891 Personal history of nicotine dependence: Secondary | ICD-10-CM | POA: Insufficient documentation

## 2013-09-16 DIAGNOSIS — IMO0002 Reserved for concepts with insufficient information to code with codable children: Secondary | ICD-10-CM

## 2013-09-16 DIAGNOSIS — E039 Hypothyroidism, unspecified: Secondary | ICD-10-CM | POA: Insufficient documentation

## 2013-09-16 DIAGNOSIS — F3289 Other specified depressive episodes: Secondary | ICD-10-CM | POA: Insufficient documentation

## 2013-09-16 DIAGNOSIS — Z792 Long term (current) use of antibiotics: Secondary | ICD-10-CM | POA: Insufficient documentation

## 2013-09-16 DIAGNOSIS — Z8619 Personal history of other infectious and parasitic diseases: Secondary | ICD-10-CM | POA: Insufficient documentation

## 2013-09-16 DIAGNOSIS — F411 Generalized anxiety disorder: Secondary | ICD-10-CM | POA: Insufficient documentation

## 2013-09-16 DIAGNOSIS — Z8719 Personal history of other diseases of the digestive system: Secondary | ICD-10-CM | POA: Insufficient documentation

## 2013-09-16 DIAGNOSIS — Z79899 Other long term (current) drug therapy: Secondary | ICD-10-CM | POA: Insufficient documentation

## 2013-09-16 DIAGNOSIS — N509 Disorder of male genital organs, unspecified: Secondary | ICD-10-CM | POA: Insufficient documentation

## 2013-09-16 DIAGNOSIS — R112 Nausea with vomiting, unspecified: Secondary | ICD-10-CM | POA: Insufficient documentation

## 2013-09-16 DIAGNOSIS — Z791 Long term (current) use of non-steroidal anti-inflammatories (NSAID): Secondary | ICD-10-CM | POA: Insufficient documentation

## 2013-09-16 DIAGNOSIS — Z7982 Long term (current) use of aspirin: Secondary | ICD-10-CM | POA: Insufficient documentation

## 2013-09-16 DIAGNOSIS — Z880 Allergy status to penicillin: Secondary | ICD-10-CM | POA: Insufficient documentation

## 2013-09-16 DIAGNOSIS — F329 Major depressive disorder, single episode, unspecified: Secondary | ICD-10-CM | POA: Insufficient documentation

## 2013-09-16 DIAGNOSIS — R42 Dizziness and giddiness: Secondary | ICD-10-CM | POA: Insufficient documentation

## 2013-09-16 LAB — CBC WITH DIFFERENTIAL/PLATELET
Basophils Absolute: 0 10*3/uL (ref 0.0–0.1)
Basophils Relative: 0 % (ref 0–1)
Eosinophils Absolute: 0.1 10*3/uL (ref 0.0–0.7)
Eosinophils Relative: 1 % (ref 0–5)
HEMATOCRIT: 37.1 % — AB (ref 39.0–52.0)
Hemoglobin: 12.6 g/dL — ABNORMAL LOW (ref 13.0–17.0)
LYMPHS ABS: 2.5 10*3/uL (ref 0.7–4.0)
LYMPHS PCT: 45 % (ref 12–46)
MCH: 31.5 pg (ref 26.0–34.0)
MCHC: 34 g/dL (ref 30.0–36.0)
MCV: 92.8 fL (ref 78.0–100.0)
Monocytes Absolute: 0.5 10*3/uL (ref 0.1–1.0)
Monocytes Relative: 8 % (ref 3–12)
NEUTROS ABS: 2.5 10*3/uL (ref 1.7–7.7)
Neutrophils Relative %: 46 % (ref 43–77)
Platelets: 216 10*3/uL (ref 150–400)
RBC: 4 MIL/uL — AB (ref 4.22–5.81)
RDW: 13.2 % (ref 11.5–15.5)
WBC: 5.6 10*3/uL (ref 4.0–10.5)

## 2013-09-16 LAB — BASIC METABOLIC PANEL
BUN: 31 mg/dL — ABNORMAL HIGH (ref 6–23)
CHLORIDE: 105 meq/L (ref 96–112)
CO2: 23 meq/L (ref 19–32)
Calcium: 9.2 mg/dL (ref 8.4–10.5)
Creatinine, Ser: 1.21 mg/dL (ref 0.50–1.35)
GFR calc Af Amer: 81 mL/min — ABNORMAL LOW (ref >90)
GFR calc non Af Amer: 70 mL/min — ABNORMAL LOW (ref >90)
GLUCOSE: 95 mg/dL (ref 70–99)
Potassium: 4.2 mEq/L (ref 3.7–5.3)
SODIUM: 139 meq/L (ref 137–147)

## 2013-09-16 MED ORDER — HYDROMORPHONE HCL PF 1 MG/ML IJ SOLN
1.0000 mg | Freq: Once | INTRAMUSCULAR | Status: AC
  Administered 2013-09-16: 1 mg via INTRAVENOUS
  Filled 2013-09-16: qty 1

## 2013-09-16 MED ORDER — METOCLOPRAMIDE HCL 5 MG/ML IJ SOLN
10.0000 mg | Freq: Once | INTRAMUSCULAR | Status: AC
  Administered 2013-09-16: 10 mg via INTRAVENOUS
  Filled 2013-09-16: qty 2

## 2013-09-16 MED ORDER — ONDANSETRON HCL 4 MG PO TABS: 4.0000 mg | ORAL_TABLET | Freq: Four times a day (QID) | ORAL | Status: DC

## 2013-09-16 MED ORDER — ONDANSETRON 4 MG PO TBDP
8.0000 mg | ORAL_TABLET | Freq: Once | ORAL | Status: AC
  Administered 2013-09-16: 8 mg via ORAL
  Filled 2013-09-16: qty 2

## 2013-09-16 MED ORDER — SODIUM CHLORIDE 0.9 % IV BOLUS (SEPSIS)
1000.0000 mL | Freq: Once | INTRAVENOUS | Status: AC
  Administered 2013-09-16: 1000 mL via INTRAVENOUS

## 2013-09-16 NOTE — Discharge Instructions (Signed)
Please followup with local urologist or her [facility] urology as we discussed.  Continue to take your antibiotics. Take Zofran for nausea. Take your pain meds as prescribed Please follow up as directed and return to the ER or see a physician for new or worsening symptoms.  Thank you. Filed Vitals:   09/16/13 0433 09/16/13 0640  BP: 121/82 109/56  Pulse: 68 63  Temp: 98 F (36.7 C)   TempSrc: Oral   Resp: 20   SpO2: 100% 97%

## 2013-09-16 NOTE — ED Notes (Signed)
The pt is nauseated he has been taking the dilaudid without anything for nausea

## 2013-09-16 NOTE — ED Notes (Signed)
Iv started med given.  Going to xray now

## 2013-09-16 NOTE — ED Notes (Signed)
The pt has had groin pain and vomiting since last Thursday.  He has been seen by [facility] and has had numerus tests.  He is taking dilaudid for pain and he has slurred speech and  He can hardly keep his eyes open

## 2013-09-16 NOTE — ED Notes (Signed)
Pt ambulated well in room. NAD noted pt alert and speakin gin full sentences

## 2013-09-16 NOTE — ED Provider Notes (Signed)
CSN: [id number]     Arrival date & time 09/16/13  0426 History   First MD Initiated Contact with Patient 09/16/13 0532     Chief Complaint  Patient presents with  . Groin Pain     (Consider location/radiation/quality/duration/timing/severity/associated sxs/prior Treatment) HPI Comments: 48 year old male with Lyme disease history, anxiety, stomach ulcer past smoker presents with worsening right scrotal pain. Earlier this week patient is diagnosed with cyst/abscess the right lateral scrotum and followup with primary Dr. in general surgeon who sent him to a urologist. Patient has been frustrated because both surgeons do not feel comfortable operating per him. Patient on antibiotics narcotics and NSAIDs with minimal improvement. Patient is currently trying to get into [facility] urology. Patient's had worsening pain and vomiting the past 24 hours. His been very stressed because of this.  Patient is a 48 y.o. male presenting with groin pain. The history is provided by the patient.  Groin Pain Pertinent negatives include no chest pain, no abdominal pain, no headaches and no shortness of breath.    Past Medical History  Diagnosis Date  . Thyroid disease   . Anxiety and depression   . Hypothyroidism   . GERD (gastroesophageal reflux disease)   . Stomach ulcer   . Allergy   . Anxiety   . Depression   . Lyme disease    Past Surgical History  Procedure Laterality Date  . Cholecystectomy    . Appendectomy     Family History  Problem Relation Age of Onset  . Colon polyps Mother   . Heart disease Father   . Diabetes Father    History  Substance Use Topics  . Smoking status: Former Smoker    Quit date: 09/24/2012  . Smokeless tobacco: Never Used  . Alcohol Use: Yes     Comment: occasional/social    Review of Systems  Constitutional: Positive for appetite change. Negative for fever and chills.  HENT: Negative for congestion.   Respiratory: Negative for shortness of breath.    Cardiovascular: Negative for chest pain.  Gastrointestinal: Positive for nausea and vomiting. Negative for abdominal pain.  Genitourinary: Positive for testicular pain. Negative for dysuria and flank pain.  Musculoskeletal: Negative for back pain, neck pain and neck stiffness.  Skin: Negative for rash.  Neurological: Positive for light-headedness. Negative for headaches.      Allergies  Penicillins and Sulfonamide derivatives  Home Medications   Prior to Admission medications   Medication Sig Start Date End Date Taking? Authorizing Provider  Aspirin-Acetaminophen-Caffeine (EXCEDRIN MIGRAINE PO) Take 2 tablets by mouth daily as needed (migraines).    Yes Historical Provider, MD  buPROPion (WELLBUTRIN XL) 300 MG 24 hr tablet Take 300 mg by mouth daily.     Yes Historical Provider, MD  clindamycin (CLEOCIN) 300 MG capsule Take 300 mg by mouth 3 (three) times daily. 09/10/13  Yes Historical Provider, MD  clonazePAM (KLONOPIN) 2 MG tablet Take 2 mg by mouth 3 (three) times daily as needed for anxiety.    Yes Historical Provider, MD  cyclobenzaprine (FLEXERIL) 10 MG tablet Take 10 mg by mouth at bedtime.    Yes Historical Provider, MD  HYDROmorphone (DILAUDID) 2 MG tablet Take 1 tablet (2 mg total) by mouth every 4 (four) hours as needed for severe pain. 09/11/13  Yes Dione Booze, MD  levothyroxine (SYNTHROID, LEVOTHROID) 112 MCG tablet Take 112 mcg by mouth daily.     Yes Historical Provider, MD  meloxicam (MOBIC) 15 MG tablet Take 15 mg by mouth  daily.   Yes Historical Provider, MD  mirtazapine (REMERON) 30 MG tablet Take 30 mg by mouth at bedtime.     Yes Historical Provider, MD  ondansetron (ZOFRAN) 4 MG tablet Take 4 mg by mouth every 8 (eight) hours as needed for nausea or vomiting.  02/24/12  Yes Shari A Upstill, PA-C  promethazine (PHENERGAN) 25 MG tablet Take 25 mg by mouth every 6 (six) hours as needed for nausea.   Yes Historical Provider, MD  SUMAtriptan (IMITREX) 100 MG tablet Take  100 mg by mouth every 2 (two) hours as needed for migraine.   Yes Historical Provider, MD  topiramate (TOPAMAX) 100 MG tablet Take 100 mg by mouth 2 (two) times daily.    Yes Historical Provider, MD  zolpidem (AMBIEN) 10 MG tablet Take 5 mg by mouth at bedtime as needed for sleep.   Yes Historical Provider, MD   BP 121/82  Pulse 68  Temp(Src) 98 F (36.7 C) (Oral)  Resp 20  SpO2 100% Physical Exam  Nursing note and vitals reviewed. Constitutional: He is oriented to person, place, and time. He appears well-developed and well-nourished.  HENT:  Head: Normocephalic and atraumatic.  Eyes: Conjunctivae are normal. Right eye exhibits no discharge. Left eye exhibits no discharge.  Neck: Normal range of motion. Neck supple. No tracheal deviation present.  Cardiovascular: Normal rate and regular rhythm.   Pulmonary/Chest: Effort normal and breath sounds normal.  Abdominal: Soft. He exhibits no distension. There is no tenderness. There is no guarding.  Genitourinary:  Patient has 2 cm area of tenderness and mild fluctuance right lateral groin/scrotum. No crepitus erythema or warmth. No drainage.  Musculoskeletal: He exhibits no edema.  Neurological: He is alert and oriented to person, place, and time.  Skin: Skin is warm. No rash noted.  Psychiatric: He has a normal mood and affect.    ED Course  Procedures (including critical care time) Labs Review Labs Reviewed  CBC WITH DIFFERENTIAL  BASIC METABOLIC PANEL    Imaging Review No results found.   EKG Interpretation None      MDM   Final diagnoses:  Abscess of right groin   Patient with recurrent scrotal pain worsening with vomiting. Clinically mildly hydrated and very frustrated with outpatient followup. Plan for IV fluids, blood work, nausea meds and repeat ultrasound to see if abscess/cyst is enlarging or any new concerns, also check blood flow to the testicle.  Ultrasounds pending.  Patient will be signed out to followup  ultrasound results. Patient has urology followup and I discussed if unable to [facility] local [facility].    Enid SkeensJoshua M Laureano Hetzer, MD 09/16/13 [phone]

## 2013-09-16 NOTE — ED Provider Notes (Signed)
Physical Exam  BP 109/56  Pulse 63  Temp(Src) 98 F (36.7 C) (Oral)  Resp 20  SpO2 97%  Physical Exam  ED Course  Procedures  Care assumed at sign out from Dr. Jodi Mourning. Patient had R scrotal abscess diagnosed several days ago and is currently on clinda. Had more pain so came to the ED. Repeat US showed resolution of the abscess. WBC nl. I think the abx is working. I think he might be anxious about his condition. I encouraged him to f/u with urology and or gen surg and get repeat US if pain worsen.    Results for orders placed during the hospital encounter of 09/16/13  CBC WITH DIFFERENTIAL      Result Value Ref Range   WBC 5.6  4.0 - 10.5 K/uL   RBC 4.00 (*) 4.22 - 5.81 MIL/uL   Hemoglobin 12.6 (*) 13.0 - 17.0 g/dL   HCT 77.8 (*) 24.2 - 35.3 %   MCV 92.8  78.0 - 100.0 fL   MCH 31.5  26.0 - 34.0 pg   MCHC 34.0  30.0 - 36.0 g/dL   RDW 61.4  43.1 - 54.0 %   Platelets 216  150 - 400 K/uL   Neutrophils Relative % 46  43 - 77 %   Neutro Abs 2.5  1.7 - 7.7 K/uL   Lymphocytes Relative 45  12 - 46 %   Lymphs Abs 2.5  0.7 - 4.0 K/uL   Monocytes Relative 8  3 - 12 %   Monocytes Absolute 0.5  0.1 - 1.0 K/uL   Eosinophils Relative 1  0 - 5 %   Eosinophils Absolute 0.1  0.0 - 0.7 K/uL   Basophils Relative 0  0 - 1 %   Basophils Absolute 0.0  0.0 - 0.1 K/uL  BASIC METABOLIC PANEL      Result Value Ref Range   Sodium 139  137 - 147 mEq/L   Potassium 4.2  3.7 - 5.3 mEq/L   Chloride 105  96 - 112 mEq/L   CO2 23  19 - 32 mEq/L   Glucose, Bld 95  70 - 99 mg/dL   BUN 31 (*) 6 - 23 mg/dL   Creatinine, Ser 0.86  0.50 - 1.35 mg/dL   Calcium 9.2  8.4 - 76.1 mg/dL   GFR calc non Af Amer 70 (*) >90 mL/min   GFR calc Af Amer 81 (*) >90 mL/min   US Pelvis Limited  09/11/2013   CLINICAL DATA:  Right scrotal soft tissue mass.  EXAM: US PELVIS LIMITED  TECHNIQUE: Ultrasound examination of the pelvic soft tissues was performed in the area of clinical concern.  COMPARISON:  DG ABD 2 VIEWS dated  11/16/2012; CT PELVIS W/CM dated 11/13/2010; US ABDOMEN COMPLETE dated 10/30/2005  FINDINGS: Within the right inguinal canal, corresponding to palpable abnormality an irregular 10 x 9 mm hypoechoic non shadowing non peristalsing mass, without superimposed vascularity.  IMPRESSION: Nonspecific 10 x 9 mm complex fluid collection right inguinal canal, this may reflect fluid insinuating from the peritoneal cavity, abscess, necrotic lymph node; this is in an area of cellulitis seen on prior CT.   Electronically Signed   By: Awilda Metro   On: 09/11/2013 02:51   US Scrotum  09/16/2013   CLINICAL DATA:  History of a right inguinal abscess with worsening pain and vomiting  EXAM: ULTRASOUND OF SCROTUM  TECHNIQUE: Complete ultrasound examination of the testicles, epididymis, and other scrotal structures was performed.  COMPARISON:  Recent prior testicular ultrasound 09/11/2013; CT pelvis 11/13/2010  FINDINGS: Right testicle  Measurements: 4.4 x 2.3 x 2.4 cm. No mass or microlithiasis visualized. Incidental note is made of a benign scrotal calcification also known as a scrotal pearl. This measures 5 mm.  Left testicle  Measurements: 4.6 x 2.4 x 3.2 cm. No mass or microlithiasis visualized.  Right epididymis:  Normal in size and appearance.  Left epididymis: Heterogeneous and irregular but nonspecific appearance of the left epididymis. No discrete mass or hypervascularity.  Hydrocele:  None visualized.  Varicocele:  None visualized.  Other: Complex fluid collection previously identified in the right groin superior to the testicle is not convincingly identified on today's study. However, there is significant artifact and imaging was technically challenging. There is a single mildly prominent hypoechoic solid structure with a fatty hilum most consistent with a reactive lymph node which measures 2.2 x 0.5 x 0.7 cm.  IMPRESSION: Technically challenging examination. The complex fluid collections seen in the right inguinal region  superior to the testicle was not convincingly identified on today's examination. There is a single prominent and likely reactive lymph node in that region.  If there is clinical concern for persistent soft tissue infection, recommend CT scan of the pelvis with contrast.  Nonspecific but abnormal heterogeneous appearance of the left epididymis without discrete mass or hypervascularity. This may represent the sequelae of prior epididymitis.   Electronically Signed   By: Malachy MoanHeath  McCullough M.D.   On: 09/16/2013 08:44        Richardean Canalavid H Jaton Eilers, MD 09/16/13 [phone]

## 2013-12-14 ENCOUNTER — Institutional Professional Consult (permissible substitution): Payer: Self-pay | Admitting: Neurology

## 2014-02-25 ENCOUNTER — Other Ambulatory Visit: Payer: Self-pay | Admitting: Internal Medicine

## 2014-02-25 DIAGNOSIS — M545 Low back pain, unspecified: Secondary | ICD-10-CM

## 2014-03-02 ENCOUNTER — Other Ambulatory Visit: Payer: BC Managed Care – PPO

## 2014-03-26 ENCOUNTER — Encounter (HOSPITAL_COMMUNITY): Payer: Self-pay

## 2014-03-26 ENCOUNTER — Emergency Department (HOSPITAL_COMMUNITY)
Admission: EM | Admit: 2014-03-26 | Discharge: 2014-03-26 | Disposition: A | Discharge status: Home / Self Care | Payer: Worker's Compensation | Attending: Emergency Medicine | Admitting: Emergency Medicine

## 2014-03-26 ENCOUNTER — Emergency Department (HOSPITAL_COMMUNITY): Payer: Worker's Compensation

## 2014-03-26 DIAGNOSIS — Y9389 Activity, other specified: Secondary | ICD-10-CM | POA: Insufficient documentation

## 2014-03-26 DIAGNOSIS — Z8619 Personal history of other infectious and parasitic diseases: Secondary | ICD-10-CM | POA: Insufficient documentation

## 2014-03-26 DIAGNOSIS — W01198A Fall on same level from slipping, tripping and stumbling with subsequent striking against other object, initial encounter: Secondary | ICD-10-CM | POA: Diagnosis not present

## 2014-03-26 DIAGNOSIS — S3992XA Unspecified injury of lower back, initial encounter: Secondary | ICD-10-CM | POA: Diagnosis not present

## 2014-03-26 DIAGNOSIS — F419 Anxiety disorder, unspecified: Secondary | ICD-10-CM | POA: Insufficient documentation

## 2014-03-26 DIAGNOSIS — S0990XA Unspecified injury of head, initial encounter: Secondary | ICD-10-CM

## 2014-03-26 DIAGNOSIS — Y998 Other external cause status: Secondary | ICD-10-CM | POA: Diagnosis not present

## 2014-03-26 DIAGNOSIS — W19XXXA Unspecified fall, initial encounter: Secondary | ICD-10-CM

## 2014-03-26 DIAGNOSIS — E039 Hypothyroidism, unspecified: Secondary | ICD-10-CM | POA: Diagnosis not present

## 2014-03-26 DIAGNOSIS — Z87891 Personal history of nicotine dependence: Secondary | ICD-10-CM | POA: Diagnosis not present

## 2014-03-26 DIAGNOSIS — F329 Major depressive disorder, single episode, unspecified: Secondary | ICD-10-CM | POA: Insufficient documentation

## 2014-03-26 DIAGNOSIS — Z791 Long term (current) use of non-steroidal anti-inflammatories (NSAID): Secondary | ICD-10-CM | POA: Diagnosis not present

## 2014-03-26 DIAGNOSIS — Z79899 Other long term (current) drug therapy: Secondary | ICD-10-CM | POA: Insufficient documentation

## 2014-03-26 DIAGNOSIS — Y9289 Other specified places as the place of occurrence of the external cause: Secondary | ICD-10-CM | POA: Diagnosis not present

## 2014-03-26 DIAGNOSIS — Z8719 Personal history of other diseases of the digestive system: Secondary | ICD-10-CM | POA: Insufficient documentation

## 2014-03-26 DIAGNOSIS — Z880 Allergy status to penicillin: Secondary | ICD-10-CM | POA: Insufficient documentation

## 2014-03-26 DIAGNOSIS — M5442 Lumbago with sciatica, left side: Secondary | ICD-10-CM

## 2014-03-26 MED ORDER — KETOROLAC TROMETHAMINE 30 MG/ML IJ SOLN
30.0000 mg | Freq: Once | INTRAMUSCULAR | Status: AC
  Administered 2014-03-26: 30 mg via INTRAMUSCULAR
  Filled 2014-03-26: qty 1

## 2014-03-26 MED ORDER — DIAZEPAM 5 MG PO TABS
5.0000 mg | ORAL_TABLET | Freq: Once | ORAL | Status: AC
  Administered 2014-03-26: 5 mg via ORAL
  Filled 2014-03-26: qty 1

## 2014-03-26 MED ORDER — OXYCODONE-ACETAMINOPHEN 5-325 MG PO TABS
1.0000 | ORAL_TABLET | Freq: Once | ORAL | Status: AC
  Administered 2014-03-26: 1 via ORAL
  Filled 2014-03-26: qty 1

## 2014-03-26 MED ORDER — HYDROCODONE-ACETAMINOPHEN 5-325 MG PO TABS: 1.0000 | ORAL_TABLET | Freq: Four times a day (QID) | ORAL | Status: AC | PRN

## 2014-03-26 MED ORDER — CYCLOBENZAPRINE HCL 10 MG PO TABS: 10.0000 mg | ORAL_TABLET | Freq: Two times a day (BID) | ORAL | Status: AC | PRN

## 2014-03-26 MED ORDER — HYDROMORPHONE HCL 1 MG/ML IJ SOLN
1.0000 mg | Freq: Once | INTRAMUSCULAR | Status: AC
  Administered 2014-03-26: 1 mg via INTRAMUSCULAR
  Filled 2014-03-26: qty 1

## 2014-03-26 MED ORDER — ONDANSETRON 8 MG PO TBDP
8.0000 mg | ORAL_TABLET | Freq: Once | ORAL | Status: AC
  Administered 2014-03-26: 8 mg via ORAL
  Filled 2014-03-26: qty 1

## 2014-03-26 MED ORDER — NAPROXEN 500 MG PO TABS: 500.0000 mg | ORAL_TABLET | Freq: Two times a day (BID) | ORAL | Status: AC

## 2014-03-26 NOTE — ED Notes (Signed)
Bed: WLPT2 Expected date:  Expected time:  Means of arrival:  Comments: 

## 2014-03-26 NOTE — ED Notes (Addendum)
Per EMS, pt is a Engineer, siteschool teacher.  Pt was getting key board off shelf while standing on chair.  Pt fell backwards and struck his head.  Pt has no bumps/laceration noted.  Nausea with no vomiting.  Slight headache.  No LOC.  No dizziness prior to fall.  Remembers fall.  Pt unable to stand.  Found by students in class room.  Vitals:  130/84, hr 72, resp 16

## 2014-03-26 NOTE — ED Provider Notes (Signed)
CSN: [id number]     Arrival date & time 03/26/14  1250 History   First MD Initiated Contact with Patient 03/26/14 1506     Chief Complaint  Patient presents with  . Headache  . Back Pain     (Consider location/radiation/quality/duration/timing/severity/associated sxs/prior Treatment) HPI Glenn Santana is a 48 y.o. male with history of anxiety, depression, peptic ulcer disease, thyroid disease, presents to emergency department after a fall. Patient is a Runner, broadcasting/film/videoteacher, states he was standing on a chair and reaching for a keyboard which was above his head. States that he underestimated awaited a keyboard and fell backwards with it landing on his stomach. Patient states he fell flat on his back, hitting back of his head on the ground. States he did not have loss of consciousness. Denies any memory loss or confusion. States he is having a headache, rates it as 5 out of 10. He admits to mild associated nausea, no vomiting. No visual changes. No numbness or weakness in extremities. Patient states his main complaint is pain in the lower back with radiation into the right leg. States radiation as intermittent, sharp. Denies any numbness or weakness in his legs. No problems with urination or bowels since the fall. Did not take any medications. He denies any neck pain. States he was unable to get up due to back pain, so EMS was called to his classroom and he was transported here. Patient denies any prior back pain history.  Past Medical History  Diagnosis Date  . Thyroid disease   . Anxiety and depression   . Hypothyroidism   . GERD (gastroesophageal reflux disease)   . Stomach ulcer   . Allergy   . Anxiety   . Depression   . Lyme disease    Past Surgical History  Procedure Laterality Date  . Cholecystectomy    . Appendectomy     Family History  Problem Relation Age of Onset  . Colon polyps Mother   . Heart disease Father   . Diabetes Father    History  Substance Use Topics  . Smoking status:  Former Smoker    Quit date: 09/24/2012  . Smokeless tobacco: Never Used  . Alcohol Use: Yes     Comment: occasional/social    Review of Systems  Constitutional: Negative for fever and chills.  Eyes: Negative for visual disturbance.  Respiratory: Negative for cough, chest tightness and shortness of breath.   Cardiovascular: Negative for chest pain, palpitations and leg swelling.  Gastrointestinal: Negative for nausea, vomiting, abdominal pain, diarrhea and abdominal distention.  Genitourinary: Negative for dysuria, urgency, frequency and hematuria.  Musculoskeletal: Positive for back pain. Negative for myalgias, neck pain and neck stiffness.  Skin: Negative for rash.  Allergic/Immunologic: Negative for immunocompromised state.  Neurological: Positive for headaches. Negative for dizziness, weakness, light-headedness and numbness.      Allergies  Penicillins and Sulfonamide derivatives  Home Medications   Prior to Admission medications   Medication Sig Start Date End Date Taking? Authorizing Provider  Aspirin-Acetaminophen-Caffeine (EXCEDRIN MIGRAINE PO) Take 2 tablets by mouth daily as needed (migraines).    Yes Historical Provider, MD  buPROPion (WELLBUTRIN XL) 300 MG 24 hr tablet Take 300 mg by mouth daily.     Yes Historical Provider, MD  clonazePAM (KLONOPIN) 2 MG tablet Take 2 mg by mouth 2 (two) times daily.    Yes Historical Provider, MD  levothyroxine (SYNTHROID, LEVOTHROID) 112 MCG tablet Take 112 mcg by mouth daily.     Yes Historical  Provider, MD  mirtazapine (REMERON) 30 MG tablet Take 30 mg by mouth at bedtime.     Yes Historical Provider, MD  ondansetron (ZOFRAN) 4 MG tablet Take 1 tablet (4 mg total) by mouth every 6 (six) hours. 09/16/13  Yes Enid Skeens, MD  topiramate (TOPAMAX) 100 MG tablet Take 100 mg by mouth 2 (two) times daily.    Yes Historical Provider, MD  zolpidem (AMBIEN) 10 MG tablet Take 5 mg by mouth at bedtime as needed for sleep.   Yes Historical  Provider, MD  HYDROmorphone (DILAUDID) 2 MG tablet Take 1 tablet (2 mg total) by mouth every 4 (four) hours as needed for severe pain. Patient not taking: Reported on 03/26/2014 09/11/13   Dione Booze, MD  meloxicam (MOBIC) 15 MG tablet Take 15 mg by mouth daily.    Historical Provider, MD  ondansetron (ZOFRAN) 4 MG tablet Take 4 mg by mouth every 8 (eight) hours as needed for nausea or vomiting.  02/24/12   Shari A Upstill, PA-C   BP 114/73 mmHg  Pulse 80  Temp(Src) 98 F (36.7 C) (Oral)  Resp 27  SpO2 99% Physical Exam  Constitutional: He appears well-developed and well-nourished. No distress.  HENT:  Head: Normocephalic and atraumatic.  Eyes: Conjunctivae and EOM are normal. Pupils are equal, round, and reactive to light.  Neck: Neck supple.  Cardiovascular: Normal rate, regular rhythm and normal heart sounds.   Pulmonary/Chest: Effort normal. No respiratory distress. He has no wheezes. He has no rales.  Abdominal: Soft. Bowel sounds are normal. He exhibits no distension. There is no tenderness. There is no rebound.  Musculoskeletal: He exhibits no edema.  No cervical or thoracic midline spine tenderness. Tenderness in the midline of lumbar spine. No pain with range of motion of the right hip with a flexed knee. Pain with right straight leg raise.  Neurological: He is alert.  Skin: Skin is warm and dry.  Nursing note and vitals reviewed.   ED Course  Procedures (including critical care time) Labs Review Labs Reviewed - No data to display  Imaging Review Dg Lumbar Spine Complete  03/26/2014   CLINICAL DATA:  Status post fall at school today with low back pain traveling down the right leg.  EXAM: LUMBAR SPINE - COMPLETE 4+ VIEW  COMPARISON:  None.  FINDINGS: There is no evidence of lumbar spine fracture. Alignment is normal. There is no dislocation. Patient is status post prior cholecystectomy. Intervertebral disc spaces are maintained.  IMPRESSION: No acute fracture or dislocation.    Electronically Signed   By: Sherian Rein M.D.   On: 03/26/2014 16:15   Dg Pelvis 1-2 Views  03/26/2014   CLINICAL DATA:  Status post fall at school today with pain in the lower back radiating down the right leg  EXAM: PELVIS - 1-2 VIEW  COMPARISON:  None.  FINDINGS: There is no evidence of pelvic fracture or diastasis. There is no dislocation.  IMPRESSION: No acute fracture dislocation.   Electronically Signed   By: Sherian Rein M.D.   On: 03/26/2014 16:19     EKG Interpretation None      MDM   Final diagnoses:  Fall  Left-sided low back pain with left-sided sciatica  Minor head injury, initial encounter    Patient is here after a fall off of the chair while reaching for a keyboard and was above his head. He is main complaint is lower back pain with radiation into the right hip. He denies any  prior back pain however it appears that patient has an MRI of lumbar spine ordered by his primary care doctor just a month ago. His exam otherwise unremarkable. There is no evidence of cauda equina. Based on [place] CT head injury rule, no imaging is indicated at this time. Patient did not lose consciousness, no evidence of basilar skull fracture, no suspected open depressed skull fracture, no vomiting, patient is 48 years old, GCS 15 at all times. Will get x-ray of lumbar spine and medicate pt.   4:59 PM Minimal pain relief with IM Dilaudid. Ordered Toradol and Valium. Will reassess and try to ambulate. X-rays negative.  Pt ambulated with no problems. Home with norco, flexeril, NSAIDs. Follow up with pcp. Pt continues not to show any signs of major head injury.   Filed Vitals:   03/26/14 1259 03/26/14 1500 03/26/14 1521 03/26/14 1753  BP: 100/49 114/73 114/73 128/92  Pulse: 60  80 62  Temp: 98 F (36.7 C)     TempSrc: Oral     Resp: 20 11 27    SpO2: 100%  99% 98%     Lottie Musselatyana A Santhiago Collingsworth, PA-C 03/27/14 0119  Linwood DibblesJon Knapp, MD 03/28/14 [phone]

## 2014-03-26 NOTE — ED Notes (Signed)
Bed: [id number] Expected date:  Expected time:  Means of arrival:  Comments: Hold for T2

## 2014-03-26 NOTE — Discharge Instructions (Signed)
naproxyn for pain. Flexeril for spasms. Norco for severe pain. Follow up with primary care doctor if pain not improving for further testing. Return if weakness or numbness in extremities, if unable to control your bowels or bladder, if develop worsening headache, memory problems, vomiting, any new concerning symptoms.   Lumbosacral Radiculopathy Lumbosacral radiculopathy is a pinched nerve or nerves in the low back (lumbosacral area). When this happens you may have weakness in your legs and may not be able to stand on your toes. You may have pain going down into your legs. There may be difficulties with walking normally. There are many causes of this problem. Sometimes this may happen from an injury, or simply from arthritis or boney problems. It may also be caused by other illnesses such as diabetes. If there is no improvement after treatment, further studies may be done to find the exact cause. DIAGNOSIS  X-rays may be needed if the problems become long standing. Electromyograms may be done. This study is one in which the working of nerves and muscles is studied. HOME CARE INSTRUCTIONS   Applications of ice packs may be helpful. Ice can be used in a plastic bag with a towel around it to prevent frostbite to skin. This may be used every 2 hours for 20 to 30 minutes, or as needed, while awake, or as directed by your caregiver.  Only take over-the-counter or prescription medicines for pain, discomfort, or fever as directed by your caregiver.  If physical therapy was prescribed, follow your caregiver's directions. SEEK IMMEDIATE MEDICAL CARE IF:   You have pain not controlled with medications.  You seem to be getting worse rather than better.  You develop increasing weakness in your legs.  You develop loss of bowel or bladder control.  You have difficulty with walking or balance, or develop clumsiness in the use of your legs.  You have a fever. MAKE SURE YOU:   Understand these  instructions.  Will watch your condition.  Will get help right away if you are not doing well or get worse. Document Released: 04/12/2005 Document Revised: 07/05/2011 Document Reviewed: 12/01/2007 [facility] Patient Information 2015 [place]. This information is not intended to replace advice given to you by your health care provider. Make sure you discuss any questions you have with your health care provider.

## 2014-03-26 NOTE — ED Notes (Addendum)
Pt states no thoughts of hurting self.  No blood thinners.  Denies neck pain.  Pain is in mid to lower back.

## 2014-03-31 ENCOUNTER — Emergency Department (HOSPITAL_COMMUNITY): Payer: Worker's Compensation

## 2014-03-31 ENCOUNTER — Encounter (HOSPITAL_COMMUNITY): Payer: Self-pay | Admitting: Emergency Medicine

## 2014-03-31 ENCOUNTER — Emergency Department (HOSPITAL_COMMUNITY)
Admission: EM | Admit: 2014-03-31 | Discharge: 2014-04-01 | Disposition: A | Discharge status: Home / Self Care | Payer: Worker's Compensation | Attending: Emergency Medicine | Admitting: Emergency Medicine

## 2014-03-31 DIAGNOSIS — Z8619 Personal history of other infectious and parasitic diseases: Secondary | ICD-10-CM | POA: Insufficient documentation

## 2014-03-31 DIAGNOSIS — E039 Hypothyroidism, unspecified: Secondary | ICD-10-CM | POA: Diagnosis not present

## 2014-03-31 DIAGNOSIS — Z791 Long term (current) use of non-steroidal anti-inflammatories (NSAID): Secondary | ICD-10-CM | POA: Diagnosis not present

## 2014-03-31 DIAGNOSIS — M546 Pain in thoracic spine: Secondary | ICD-10-CM | POA: Diagnosis not present

## 2014-03-31 DIAGNOSIS — Z8719 Personal history of other diseases of the digestive system: Secondary | ICD-10-CM | POA: Diagnosis not present

## 2014-03-31 DIAGNOSIS — Z880 Allergy status to penicillin: Secondary | ICD-10-CM | POA: Diagnosis not present

## 2014-03-31 DIAGNOSIS — G8911 Acute pain due to trauma: Secondary | ICD-10-CM | POA: Insufficient documentation

## 2014-03-31 DIAGNOSIS — F329 Major depressive disorder, single episode, unspecified: Secondary | ICD-10-CM | POA: Insufficient documentation

## 2014-03-31 DIAGNOSIS — Z87891 Personal history of nicotine dependence: Secondary | ICD-10-CM | POA: Insufficient documentation

## 2014-03-31 DIAGNOSIS — F419 Anxiety disorder, unspecified: Secondary | ICD-10-CM | POA: Insufficient documentation

## 2014-03-31 DIAGNOSIS — R52 Pain, unspecified: Secondary | ICD-10-CM

## 2014-03-31 DIAGNOSIS — Z79899 Other long term (current) drug therapy: Secondary | ICD-10-CM | POA: Diagnosis not present

## 2014-03-31 LAB — CBC WITH DIFFERENTIAL/PLATELET
BASOS ABS: 0 10*3/uL (ref 0.0–0.1)
BASOS PCT: 0 % (ref 0–1)
EOS PCT: 1 % (ref 0–5)
Eosinophils Absolute: 0.1 10*3/uL (ref 0.0–0.7)
HEMATOCRIT: 40.8 % (ref 39.0–52.0)
Hemoglobin: 13.8 g/dL (ref 13.0–17.0)
Lymphocytes Relative: 52 % — ABNORMAL HIGH (ref 12–46)
Lymphs Abs: 3.3 10*3/uL (ref 0.7–4.0)
MCH: 31.6 pg (ref 26.0–34.0)
MCHC: 33.8 g/dL (ref 30.0–36.0)
MCV: 93.4 fL (ref 78.0–100.0)
MONO ABS: 0.4 10*3/uL (ref 0.1–1.0)
Monocytes Relative: 6 % (ref 3–12)
NEUTROS ABS: 2.6 10*3/uL (ref 1.7–7.7)
Neutrophils Relative %: 41 % — ABNORMAL LOW (ref 43–77)
Platelets: 238 10*3/uL (ref 150–400)
RBC: 4.37 MIL/uL (ref 4.22–5.81)
RDW: 12.8 % (ref 11.5–15.5)
WBC: 6.4 10*3/uL (ref 4.0–10.5)

## 2014-03-31 LAB — COMPREHENSIVE METABOLIC PANEL
ALBUMIN: 3.8 g/dL (ref 3.5–5.2)
ALT: 26 U/L (ref 0–53)
AST: 17 U/L (ref 0–37)
Alkaline Phosphatase: 67 U/L (ref 39–117)
Anion gap: 13 (ref 5–15)
BUN: 18 mg/dL (ref 6–23)
CALCIUM: 9.2 mg/dL (ref 8.4–10.5)
CHLORIDE: 104 meq/L (ref 96–112)
CO2: 24 mEq/L (ref 19–32)
CREATININE: 1.3 mg/dL (ref 0.50–1.35)
GFR calc Af Amer: 75 mL/min — ABNORMAL LOW (ref >90)
GFR calc non Af Amer: 64 mL/min — ABNORMAL LOW (ref >90)
Glucose, Bld: 85 mg/dL (ref 70–99)
Potassium: 4 mEq/L (ref 3.7–5.3)
Sodium: 141 mEq/L (ref 137–147)
Total Bilirubin: 0.3 mg/dL (ref 0.3–1.2)
Total Protein: 6.8 g/dL (ref 6.0–8.3)

## 2014-03-31 LAB — URINALYSIS, ROUTINE W REFLEX MICROSCOPIC
Bilirubin Urine: NEGATIVE
GLUCOSE, UA: NEGATIVE mg/dL
Hgb urine dipstick: NEGATIVE
KETONES UR: NEGATIVE mg/dL
LEUKOCYTES UA: NEGATIVE
Nitrite: NEGATIVE
Protein, ur: NEGATIVE mg/dL
Specific Gravity, Urine: 1.016 (ref 1.005–1.030)
UROBILINOGEN UA: 1 mg/dL (ref 0.0–1.0)
pH: 6.5 (ref 5.0–8.0)

## 2014-03-31 MED ORDER — HYDROMORPHONE HCL 1 MG/ML IJ SOLN
1.0000 mg | Freq: Once | INTRAMUSCULAR | Status: DC
Start: 2014-03-31 — End: 2014-03-31

## 2014-03-31 MED ORDER — HYDROMORPHONE HCL 1 MG/ML IJ SOLN
1.0000 mg | Freq: Once | INTRAMUSCULAR | Status: AC
Start: 2014-03-31 — End: 2014-03-31
  Administered 2014-03-31: 1 mg via INTRAVENOUS
  Filled 2014-03-31: qty 1

## 2014-03-31 MED ORDER — SODIUM CHLORIDE 0.9 % IV BOLUS (SEPSIS)
1000.0000 mL | Freq: Once | INTRAVENOUS | Status: AC
  Administered 2014-03-31: 1000 mL via INTRAVENOUS

## 2014-03-31 MED ORDER — DIAZEPAM 5 MG/ML IJ SOLN
5.0000 mg | Freq: Once | INTRAMUSCULAR | Status: AC
  Administered 2014-03-31: 5 mg via INTRAVENOUS
  Filled 2014-03-31: qty 2

## 2014-03-31 MED ORDER — KETOROLAC TROMETHAMINE 30 MG/ML IJ SOLN
30.0000 mg | Freq: Once | INTRAMUSCULAR | Status: AC
  Administered 2014-03-31: 30 mg via INTRAVENOUS
  Filled 2014-03-31: qty 1

## 2014-03-31 MED ORDER — DIAZEPAM 5 MG/ML IJ SOLN
5.0000 mg | Freq: Once | INTRAMUSCULAR | Status: AC
  Administered 2014-03-31: 5 mg via INTRAMUSCULAR
  Filled 2014-03-31: qty 2

## 2014-03-31 MED ORDER — HYDROMORPHONE HCL 1 MG/ML IJ SOLN
1.0000 mg | Freq: Once | INTRAMUSCULAR | Status: AC
  Administered 2014-03-31: 1 mg via INTRAMUSCULAR
  Filled 2014-03-31: qty 1

## 2014-03-31 NOTE — ED Notes (Signed)
Pt aware urine sample is needed, urinal given 

## 2014-03-31 NOTE — ED Notes (Signed)
Pt c/o L sided back spasms radiating to L neck onset 5 days with n/v. [facility]

## 2014-03-31 NOTE — ED Notes (Addendum)
Patient states he fell out of a chair on Tuesday. [facility]. Had mid back pain that radiated to right leg and neck pain. Since Tuesday, the neck pain has got worse and only pain to the left mid back with no radiation. Pt was seen on Friday at Urgent Care for pain and nausea/vomiting from pain. Denies any abd pain.

## 2014-03-31 NOTE — ED Notes (Signed)
Provider at bedside. Lemont Fillersatyana, PA.

## 2014-03-31 NOTE — ED Provider Notes (Signed)
CSN: [id number]     Arrival date & time 03/31/14  1922 History   First MD Initiated Contact with Patient 03/31/14 1952     Chief Complaint  Patient presents with  . Back Pain     (Consider location/radiation/quality/duration/timing/severity/associated sxs/prior Treatment) HPI Glenn Santana is a 48 y.o. male who presents to ED with complaint of left back pain. Patient states he fell off the chair 5 days ago, since then lost back pain. He was seen here in the ER, treated for the pain, thoracic lumbar films negative. He states that at that time he had pain radiating down left leg, but that now resolved. He states he only has pain in the left mid back, does not radiate, is sharp, comes and goes. He states when it comes, it is 10 out of 10, makes him nauseated, it makes him throw up. He is nauseated from Percocet that he is taking. He was seen at urgent care 2 days ago, was diagnosed again with back pain and was prescribed Phenergan. He states he takes Phenergan which helps some of his nausea. He states he hasn't eaten in 5 days due to his nausea. He denies fever or chills. Denies any urinary or bowel incontinence or retention. He denies any numbness or weakness in his extremities. He states he has been putting ice on that area with no relief of his symptoms.  Past Medical History  Diagnosis Date  . Thyroid disease   . Anxiety and depression   . Hypothyroidism   . GERD (gastroesophageal reflux disease)   . Stomach ulcer   . Allergy   . Anxiety   . Depression   . Lyme disease    Past Surgical History  Procedure Laterality Date  . Cholecystectomy    . Appendectomy     Family History  Problem Relation Age of Onset  . Colon polyps Mother   . Heart disease Father   . Diabetes Father    History  Substance Use Topics  . Smoking status: Former Smoker    Quit date: 09/24/2012  . Smokeless tobacco: Never Used  . Alcohol Use: Yes     Comment: occasional/social    Review of Systems   Constitutional: Negative for fever and chills.  Respiratory: Negative for cough, chest tightness and shortness of breath.   Cardiovascular: Negative for chest pain, palpitations and leg swelling.  Gastrointestinal: Negative for nausea, vomiting, abdominal pain, diarrhea and abdominal distention.  Genitourinary: Negative for dysuria, urgency, frequency and hematuria.  Musculoskeletal: Positive for back pain and arthralgias. Negative for myalgias, neck pain and neck stiffness.  Skin: Negative for rash.  Allergic/Immunologic: Negative for immunocompromised state.  Neurological: Negative for dizziness, weakness, light-headedness, numbness and headaches.      Allergies  Penicillins and Sulfonamide derivatives  Home Medications   Prior to Admission medications   Medication Sig Start Date End Date Taking? Authorizing Provider  Aspirin-Acetaminophen-Caffeine (EXCEDRIN MIGRAINE PO) Take 2 tablets by mouth daily as needed (migraines).    Yes Historical Provider, MD  buPROPion (WELLBUTRIN XL) 300 MG 24 hr tablet Take 300 mg by mouth daily.     Yes Historical Provider, MD  clonazePAM (KLONOPIN) 2 MG tablet Take 2 mg by mouth 2 (two) times daily.    Yes Historical Provider, MD  cyclobenzaprine (FLEXERIL) 10 MG tablet Take 1 tablet (10 mg total) by mouth 2 (two) times daily as needed for muscle spasms. 03/26/14  Yes Homer Pfeifer A Marlyce Mcdougald, PA-C  HYDROcodone-acetaminophen (NORCO) 5-325 MG per tablet  Take 1 tablet by mouth every 6 (six) hours as needed. 03/26/14  Yes Bernis Stecher A Alphus Zeck, PA-C  meloxicam (MOBIC) 15 MG tablet Take 15 mg by mouth daily.   Yes Historical Provider, MD  mirtazapine (REMERON) 30 MG tablet Take 30 mg by mouth at bedtime.     Yes Historical Provider, MD  ondansetron (ZOFRAN-ODT) 8 MG disintegrating tablet Take 8 mg by mouth every 8 (eight) hours as needed for nausea or vomiting.   Yes Historical Provider, MD  SYNTHROID 150 MCG tablet Take 1 tablet by mouth daily before breakfast.  02/09/14  Yes Historical Provider, MD  topiramate (TOPAMAX) 100 MG tablet Take 100 mg by mouth 2 (two) times daily.    Yes Historical Provider, MD  zolpidem (AMBIEN) 10 MG tablet Take 5 mg by mouth at bedtime as needed for sleep.   Yes Historical Provider, MD  HYDROmorphone (DILAUDID) 2 MG tablet Take 1 tablet (2 mg total) by mouth every 4 (four) hours as needed for severe pain. Patient not taking: Reported on 03/26/2014 09/11/13   Dione Boozeavid Glick, MD  naproxen (NAPROSYN) 500 MG tablet Take 1 tablet (500 mg total) by mouth 2 (two) times daily. 03/26/14   Iraida Cragin A Giani Betzold, PA-C   BP 136/85 mmHg  Pulse 82  Temp(Src) 97.7 F (36.5 C) (Oral)  Resp 18  Ht 6\' 1"  (1.854 m)  Wt 236 lb (107.049 kg)  BMI 31.14 kg/m2  SpO2 98% Physical Exam  Constitutional: He is oriented to person, place, and time. He appears well-developed and well-nourished. No distress.  HENT:  Head: Normocephalic.  Neck: Normal range of motion. Neck supple.  Cardiovascular: Normal rate, regular rhythm and normal heart sounds.   Pulmonary/Chest: Effort normal and breath sounds normal. No respiratory distress. He has no wheezes. He has no rales. He exhibits tenderness.  Tenderness over left lower posterior ribs  Abdominal: Soft. There is no tenderness.  Musculoskeletal: He exhibits no edema.  tendernss over left perivertebral muscles of the thoracic spine. Entire spine non tender. Full ROM of bilateral upper and lower extremities with no pain  Neurological: He is alert and oriented to person, place, and time.  5/5 and equal lower extremity strength. 2+ and equal patellar reflexes bilaterally. Pt able to dorsiflex bilateral toes and feet with good strength against resistance. Equal sensation bilaterally over thighs and lower legs.   Skin: Skin is warm and dry.  Nursing note and vitals reviewed.   ED Course  Procedures (including critical care time) Labs Review Labs Reviewed  CBC WITH DIFFERENTIAL - Abnormal; Notable for the  following:    Neutrophils Relative % 41 (*)    Lymphocytes Relative 52 (*)    All other components within normal limits  COMPREHENSIVE METABOLIC PANEL - Abnormal; Notable for the following:    GFR calc non Af Amer 64 (*)    GFR calc Af Amer 75 (*)    All other components within normal limits  URINALYSIS, ROUTINE W REFLEX MICROSCOPIC    Imaging Review No results found.   EKG Interpretation None      MDM   Final diagnoses:  Pain  Left-sided thoracic back pain   Pt here after a fall 5 days ago. At that time, pain in lower back, left side radiating into left hip. States that improved, now having pain in left mid back. Pain comes and goes. Describes as spasms. Will treat pain. Will get rib films.   10:12 PM Pain only down to 8.5 from 10/10 after 1mg   of dilaudid and 5mg  of valium im. Will try another dose of dilaudid.   Patient discussed with Dr. Silverio Lay, continues to have pain. Will workup for possible renal colic. Labs and urinalysis ordered.  12:15 AM Patient feels much better. Pain is 0. He is ready to go home. Neurovascularly intact, labs normal. UA negative. Doubt renal colic, doubt any other emergent process. Most likely spasms, although discussed with pt, if pain not improving to follow up with pcp. Will discharge home with Valium instead of Flexeril, continue Percocet, follow-up with primary care doctor.  Filed Vitals:   03/31/14 1933 03/31/14 2154 03/31/14 2353  BP: 136/85 137/78 130/84  Pulse: 82 71 65  Temp: 97.7 F (36.5 C) 98 F (36.7 C) 97.8 F (36.6 C)  TempSrc: Oral Oral Oral  Resp: 18 20 20   Height: 6\' 1"  (1.854 m)    Weight: 236 lb (107.049 kg)    SpO2: 98% 99% 100%     Lottie Mussel, PA-C 04/01/14 0103  Richardean Canal, MD 04/01/14 [phone]

## 2014-03-31 NOTE — ED Notes (Signed)
Patient is able to use phone while lying on stretcher. No moaning or groaning.

## 2014-03-31 NOTE — ED Notes (Signed)
Denis abd pain.

## 2014-03-31 NOTE — ED Notes (Signed)
Bed: WA20 Expected date:  Expected time:  Means of arrival:  Comments: Tr 1 

## 2014-03-31 NOTE — ED Notes (Signed)
Patient has left neck tenderness. No numbness or tingling in the neck region.

## 2014-04-01 MED ORDER — DIAZEPAM 5 MG PO TABS: 5.0000 mg | ORAL_TABLET | Freq: Two times a day (BID) | ORAL | Status: AC

## 2014-04-01 NOTE — Discharge Instructions (Signed)
Take valium instead of flexeril for spasms. Continue pain medications. Try heating pads. Follow up with your doctor on Wednesday if not improving.   Muscle Cramps and Spasms Muscle cramps and spasms occur when a muscle or muscles tighten and you have no control over this tightening (involuntary muscle contraction). They are a common problem and can develop in any muscle. The most common place is in the calf muscles of the leg. Both muscle cramps and muscle spasms are involuntary muscle contractions, but they also have differences:   Muscle cramps are sporadic and painful. They may last a few seconds to a quarter of an hour. Muscle cramps are often more forceful and last longer than muscle spasms.  Muscle spasms may or may not be painful. They may also last just a few seconds or much longer. CAUSES  It is uncommon for cramps or spasms to be due to a serious underlying problem. In many cases, the cause of cramps or spasms is unknown. Some common causes are:   Overexertion.   Overuse from repetitive motions (doing the same thing over and over).   Remaining in a certain position for a long period of time.   Improper preparation, form, or technique while performing a sport or activity.   Dehydration.   Injury.   Side effects of some medicines.   Abnormally low levels of the salts and ions in your blood (electrolytes), especially potassium and calcium. This could happen if you are taking water pills (diuretics) or you are pregnant.  Some underlying medical problems can make it more likely to develop cramps or spasms. These include, but are not limited to:   Diabetes.   Parkinson disease.   Hormone disorders, such as thyroid problems.   Alcohol abuse.   Diseases specific to muscles, joints, and bones.   Blood vessel disease where not enough blood is getting to the muscles.  HOME CARE INSTRUCTIONS   Stay well hydrated. Drink enough water and fluids to keep your urine  clear or pale yellow.  It may be helpful to massage, stretch, and relax the affected muscle.  For tight or tense muscles, use a warm towel, heating pad, or hot shower water directed to the affected area.  If you are sore or have pain after a cramp or spasm, applying ice to the affected area may relieve discomfort.  Put ice in a plastic bag.  Place a towel between your skin and the bag.  Leave the ice on for 15-20 minutes, 03-04 times a day.  Medicines used to treat a known cause of cramps or spasms may help reduce their frequency or severity. Only take over-the-counter or prescription medicines as directed by your caregiver. SEEK MEDICAL CARE IF:  Your cramps or spasms get more severe, more frequent, or do not improve over time.  MAKE SURE YOU:   Understand these instructions.  Will watch your condition.  Will get help right away if you are not doing well or get worse. Document Released: 10/02/2001 Document Revised: 08/07/2012 Document Reviewed: 03/29/2012 [facility] Patient Information 2015 [place], [place]. This information is not intended to replace advice given to you by your health care provider. Make sure you discuss any questions you have with your health care provider.

## 2014-05-09 ENCOUNTER — Encounter (HOSPITAL_COMMUNITY): Payer: Self-pay | Admitting: Internal Medicine

## 2014-11-21 ENCOUNTER — Emergency Department (HOSPITAL_COMMUNITY)
Admission: EM | Admit: 2014-11-21 | Discharge: 2014-11-22 | Disposition: A | Discharge status: Home / Self Care | Payer: BC Managed Care – PPO | Attending: Emergency Medicine | Admitting: Emergency Medicine

## 2014-11-21 ENCOUNTER — Encounter (HOSPITAL_COMMUNITY): Payer: Self-pay | Admitting: Emergency Medicine

## 2014-11-21 DIAGNOSIS — Z8619 Personal history of other infectious and parasitic diseases: Secondary | ICD-10-CM | POA: Diagnosis not present

## 2014-11-21 DIAGNOSIS — E039 Hypothyroidism, unspecified: Secondary | ICD-10-CM | POA: Diagnosis not present

## 2014-11-21 DIAGNOSIS — R0602 Shortness of breath: Secondary | ICD-10-CM | POA: Insufficient documentation

## 2014-11-21 DIAGNOSIS — J029 Acute pharyngitis, unspecified: Secondary | ICD-10-CM | POA: Diagnosis not present

## 2014-11-21 DIAGNOSIS — F419 Anxiety disorder, unspecified: Secondary | ICD-10-CM | POA: Insufficient documentation

## 2014-11-21 DIAGNOSIS — Z880 Allergy status to penicillin: Secondary | ICD-10-CM | POA: Insufficient documentation

## 2014-11-21 DIAGNOSIS — R05 Cough: Secondary | ICD-10-CM | POA: Insufficient documentation

## 2014-11-21 DIAGNOSIS — Z791 Long term (current) use of non-steroidal anti-inflammatories (NSAID): Secondary | ICD-10-CM | POA: Insufficient documentation

## 2014-11-21 DIAGNOSIS — Z87891 Personal history of nicotine dependence: Secondary | ICD-10-CM | POA: Insufficient documentation

## 2014-11-21 DIAGNOSIS — F329 Major depressive disorder, single episode, unspecified: Secondary | ICD-10-CM | POA: Diagnosis not present

## 2014-11-21 DIAGNOSIS — K259 Gastric ulcer, unspecified as acute or chronic, without hemorrhage or perforation: Secondary | ICD-10-CM | POA: Diagnosis not present

## 2014-11-21 DIAGNOSIS — K219 Gastro-esophageal reflux disease without esophagitis: Secondary | ICD-10-CM | POA: Diagnosis not present

## 2014-11-21 DIAGNOSIS — Z79899 Other long term (current) drug therapy: Secondary | ICD-10-CM | POA: Insufficient documentation

## 2014-11-21 DIAGNOSIS — R059 Cough, unspecified: Secondary | ICD-10-CM

## 2014-11-21 NOTE — ED Notes (Signed)
Pt arrived to the ED with a complaint of a cough.  Pt states that he has had a cough since April.  Pt states that he has seen the PCP and been prescribed multiple medications without relief.  Pt states that he felt as if his throat was constricting tonight and that he was having a hard time breathing.

## 2014-11-22 MED ORDER — IPRATROPIUM-ALBUTEROL 0.5-2.5 (3) MG/3ML IN SOLN
3.0000 mL | Freq: Once | RESPIRATORY_TRACT | Status: AC
  Administered 2014-11-22: 3 mL via RESPIRATORY_TRACT
  Filled 2014-11-22: qty 3

## 2014-11-22 MED ORDER — SUCRALFATE 1 G PO TABS
1.0000 g | ORAL_TABLET | ORAL | Status: AC
  Administered 2014-11-22: 1 g via ORAL
  Filled 2014-11-22: qty 1

## 2014-11-22 MED ORDER — SUCRALFATE 1 GM/10ML PO SUSP
1.0000 g | Freq: Three times a day (TID) | ORAL | Status: AC
Start: 2014-11-22 — End: ?

## 2014-11-22 MED ORDER — AEROCHAMBER PLUS FLO-VU LARGE MISC
1.0000 | Freq: Once | Status: AC
  Administered 2014-11-22: 1
  Filled 2014-11-22: qty 1

## 2014-11-22 NOTE — Discharge Instructions (Signed)
Cough, Adult  A cough is a reflex. It helps you clear your throat and airways. A cough can help heal your body. A cough can last 2 or 3 weeks (acute) or may last more than 8 weeks (chronic). Some common causes of a cough can include an infection, allergy, or a cold. HOME CARE  Only take medicine as told by your doctor.  If given, take your medicines (antibiotics) as told. Finish them even if you start to feel better.  Use a cold steam vaporizer or humidifier in your home. This can help loosen thick spit (secretions).  Sleep so you are almost sitting up (semi-upright). Use pillows to do this. This helps reduce coughing.  Rest as needed.  Stop smoking if you smoke. GET HELP RIGHT AWAY IF:  You have yellowish-white fluid (pus) in your thick spit.  Your cough gets worse.  Your medicine does not reduce coughing, and you are losing sleep.  You cough up blood.  You have trouble breathing.  Your pain gets worse and medicine does not help.  You have a fever. MAKE SURE YOU:   Understand these instructions.  Will watch your condition.  Will get help right away if you are not doing well or get worse. Document Released: 12/24/2010 Document Revised: 08/27/2013 Document Reviewed: 12/24/2010 [facility] Patient Information 2015 [place], [place]. This information is not intended to replace advice given to you by your health care provider. Make sure you discuss any questions you have with your health care provider. Continue taking medications previously prescribed, but I do believe that your cough has been caused by GERD.  You have been referred to a gastroenterologist.  Please call and make an appointment telling me, referred through the emergency department.  You have been prescribed a medication called Carafate.  Please take this as directed.  30 minutes prior to each meal and bedtime. Please use your albuterol inhaler with spacer every 4-6 hours while awake for the next 2 days and then as  needed thereafter.  If you do wake during the night with coughing episodes.  Please use the medication, but do not suture along clock to wake yourself up to use the inhaler.

## 2014-11-22 NOTE — ED Notes (Signed)
Pt alert, oriented, and ambulatory upon DC. He was advised to follow up with GI. He was instructed on how to use spacer. He verbalizes understanding.

## 2014-11-22 NOTE — ED Provider Notes (Signed)
CSN: [id number]     Arrival date & time 11/21/14  2341 History   First MD Initiated Contact with Patient 11/22/14 0002     Chief Complaint  Patient presents with  . Cough     (Consider location/radiation/quality/duration/timing/severity/associated sxs/prior Treatment) HPI Comments: Patient states he's had a persistent cough since April has no known history of asthma.  He has been treated with multiple courses of anti-biotic and steroids with no relief. He does have an appointment with ENT on Monday.  Due to her persistent sore throat, most likely from a cough. He states he was most recently in urgent care  He did get an albuterol treatment, which did help his cough considerably, and they did send him home with an inhaler, but at home.  This is not helping.  He was not given a spacer.  Patient is a 49 y.o. male presenting with cough. The history is provided by the patient.  Cough Cough characteristics:  Non-productive, harsh and paroxysmal Severity:  Severe Onset quality:  Gradual Duration: since April. Timing:  Intermittent Progression:  Worsening Chronicity:  New Smoker: no   Relieved by:  Nothing Worsened by:  Nothing tried Ineffective treatments:  Beta-agonist inhaler (anti-biotics and sterile) Associated symptoms: shortness of breath and sore throat   Associated symptoms: no chest pain, no fever, no rhinorrhea and no wheezing   Shortness of breath:    Severity:  Moderate   Onset quality:  Gradual   Timing:  Intermittent   Progression:  Worsening Sore throat:    Severity:  Mild   Onset quality:  Gradual   Timing:  Intermittent   Progression:  Worsening   Past Medical History  Diagnosis Date  . Thyroid disease   . Anxiety and depression   . Hypothyroidism   . GERD (gastroesophageal reflux disease)   . Stomach ulcer   . Allergy   . Anxiety   . Depression   . Lyme disease    Past Surgical History  Procedure Laterality Date  . Cholecystectomy    . Appendectomy      Family History  Problem Relation Age of Onset  . Colon polyps Mother   . Heart disease Father   . Diabetes Father    History  Substance Use Topics  . Smoking status: Former Smoker    Quit date: 09/24/2012  . Smokeless tobacco: Never Used  . Alcohol Use: Yes     Comment: occasional/social    Review of Systems  Constitutional: Negative for fever.  HENT: Positive for sinus pressure and sore throat. Negative for rhinorrhea, trouble swallowing and voice change.   Respiratory: Positive for cough and shortness of breath. Negative for wheezing.   Cardiovascular: Negative for chest pain.  All other systems reviewed and are negative.     Allergies  Penicillins and Sulfonamide derivatives  Home Medications   Prior to Admission medications   Medication Sig Start Date End Date Taking? Authorizing Provider  Aspirin-Acetaminophen-Caffeine (EXCEDRIN MIGRAINE PO) Take 2 tablets by mouth daily as needed (migraines).     Historical Provider, MD  buPROPion (WELLBUTRIN XL) 300 MG 24 hr tablet Take 300 mg by mouth daily.      Historical Provider, MD  clonazePAM (KLONOPIN) 2 MG tablet Take 2 mg by mouth 2 (two) times daily.     Historical Provider, MD  cyclobenzaprine (FLEXERIL) 10 MG tablet Take 1 tablet (10 mg total) by mouth 2 (two) times daily as needed for muscle spasms. 03/26/14   Jaynie Crumble, PA-C  diazepam (VALIUM) 5 MG tablet Take 1 tablet (5 mg total) by mouth 2 (two) times daily. 04/01/14   Tatyana Kirichenko, PA-C  HYDROcodone-acetaminophen (NORCO) 5-325 MG per tablet Take 1 tablet by mouth every 6 (six) hours as needed. 03/26/14   Tatyana Kirichenko, PA-C  HYDROmorphone (DILAUDID) 2 MG tablet Take 1 tablet (2 mg total) by mouth every 4 (four) hours as needed for severe pain. Patient not taking: Reported on 03/26/2014 09/11/13   Dione Booze, MD  meloxicam (MOBIC) 15 MG tablet Take 15 mg by mouth daily.    Historical Provider, MD  mirtazapine (REMERON) 30 MG tablet Take 30 mg by  mouth at bedtime.      Historical Provider, MD  naproxen (NAPROSYN) 500 MG tablet Take 1 tablet (500 mg total) by mouth 2 (two) times daily. 03/26/14   Tatyana Kirichenko, PA-C  ondansetron (ZOFRAN-ODT) 8 MG disintegrating tablet Take 8 mg by mouth every 8 (eight) hours as needed for nausea or vomiting.    Historical Provider, MD  sucralfate (CARAFATE) 1 GM/10ML suspension Take 10 mLs (1 g total) by mouth 4 (four) times daily -  with meals and at bedtime. 11/22/14   Earley Favor, NP  SYNTHROID 150 MCG tablet Take 1 tablet by mouth daily before breakfast. 02/09/14   Historical Provider, MD  topiramate (TOPAMAX) 100 MG tablet Take 100 mg by mouth 2 (two) times daily.     Historical Provider, MD  zolpidem (AMBIEN) 10 MG tablet Take 5 mg by mouth at bedtime as needed for sleep.    Historical Provider, MD   BP 108/89 mmHg  Pulse 85  Temp(Src) 98.3 F (36.8 C)  Resp 18  Ht  (1.854 m)  Wt 225 lb (102.059 kg)  BMI 29.69 kg/m2  SpO2 94% Physical Exam  Constitutional: He is oriented to person, place, and time. He appears well-developed and well-nourished.  HENT:  Head: Normocephalic.  Mouth/Throat: Oropharynx is clear and moist.  Eyes: Pupils are equal, round, and reactive to light.  Neck: Normal range of motion.  Cardiovascular: Normal rate and regular rhythm.   Pulmonary/Chest: Effort normal and breath sounds normal. No respiratory distress. He has no wheezes. He exhibits no tenderness.  Lymphadenopathy:    He has no cervical adenopathy.  Neurological: He is alert and oriented to person, place, and time.  Skin: Skin is warm and dry.  Nursing note and vitals reviewed.   ED Course  Procedures (including critical care time) Labs Review Labs Reviewed - No data to display  Imaging Review No results found.   EKG Interpretation None     i'm thinking that maybe the patient's GERD has exacerbated and this is causing his cough.  Since he has been treated for an asthma-like or  bronchitis-like cough without any resolution.  I will try giving the patient.  Carafate to see if this does help Patient has no longer had any episodes of  coughing.  He states he feels much better.  He will be sent home with prescription for Carafate to use 4 times a day  he has been given a GI referral as I do believe that his coughing episodes are more GERD related.  He is been instructed to continue his present medication regime.  Follow-up with the ENT specialist on Monday as scheduled and schedule an appointment with gastroenterology MDM   Final diagnoses:  Cough         Earley Favor, NP 11/22/14 [id number]  Devoria Albe, MD 11/22/14 [phone]

## 2014-12-02 ENCOUNTER — Encounter: Payer: Self-pay | Admitting: Internal Medicine

## 2014-12-02 DIAGNOSIS — R05 Cough: Secondary | ICD-10-CM | POA: Insufficient documentation

## 2014-12-02 DIAGNOSIS — R059 Cough, unspecified: Secondary | ICD-10-CM | POA: Insufficient documentation

## 2014-12-11 ENCOUNTER — Ambulatory Visit: Payer: Self-pay | Admitting: Nurse Practitioner

## 2015-01-24 ENCOUNTER — Other Ambulatory Visit: Payer: Self-pay | Admitting: Occupational Medicine

## 2015-01-24 ENCOUNTER — Ambulatory Visit: Payer: Self-pay

## 2015-01-24 DIAGNOSIS — M25561 Pain in right knee: Secondary | ICD-10-CM

## 2015-01-26 ENCOUNTER — Emergency Department (HOSPITAL_BASED_OUTPATIENT_CLINIC_OR_DEPARTMENT_OTHER): Payer: Worker's Compensation

## 2015-01-26 ENCOUNTER — Encounter (HOSPITAL_BASED_OUTPATIENT_CLINIC_OR_DEPARTMENT_OTHER): Payer: Self-pay | Admitting: *Deleted

## 2015-01-26 ENCOUNTER — Emergency Department (HOSPITAL_BASED_OUTPATIENT_CLINIC_OR_DEPARTMENT_OTHER)
Admission: EM | Admit: 2015-01-26 | Discharge: 2015-01-26 | Disposition: A | Discharge status: Home / Self Care | Payer: Worker's Compensation | Attending: Physician Assistant | Admitting: Physician Assistant

## 2015-01-26 DIAGNOSIS — Z880 Allergy status to penicillin: Secondary | ICD-10-CM | POA: Diagnosis not present

## 2015-01-26 DIAGNOSIS — K219 Gastro-esophageal reflux disease without esophagitis: Secondary | ICD-10-CM | POA: Diagnosis not present

## 2015-01-26 DIAGNOSIS — Y998 Other external cause status: Secondary | ICD-10-CM | POA: Diagnosis not present

## 2015-01-26 DIAGNOSIS — Z79899 Other long term (current) drug therapy: Secondary | ICD-10-CM | POA: Diagnosis not present

## 2015-01-26 DIAGNOSIS — M79604 Pain in right leg: Secondary | ICD-10-CM

## 2015-01-26 DIAGNOSIS — E039 Hypothyroidism, unspecified: Secondary | ICD-10-CM | POA: Diagnosis not present

## 2015-01-26 DIAGNOSIS — Y9389 Activity, other specified: Secondary | ICD-10-CM | POA: Diagnosis not present

## 2015-01-26 DIAGNOSIS — F329 Major depressive disorder, single episode, unspecified: Secondary | ICD-10-CM | POA: Insufficient documentation

## 2015-01-26 DIAGNOSIS — Z87891 Personal history of nicotine dependence: Secondary | ICD-10-CM | POA: Diagnosis not present

## 2015-01-26 DIAGNOSIS — Z791 Long term (current) use of non-steroidal anti-inflammatories (NSAID): Secondary | ICD-10-CM | POA: Insufficient documentation

## 2015-01-26 DIAGNOSIS — W01198A Fall on same level from slipping, tripping and stumbling with subsequent striking against other object, initial encounter: Secondary | ICD-10-CM | POA: Diagnosis not present

## 2015-01-26 DIAGNOSIS — S8991XA Unspecified injury of right lower leg, initial encounter: Secondary | ICD-10-CM | POA: Insufficient documentation

## 2015-01-26 DIAGNOSIS — F419 Anxiety disorder, unspecified: Secondary | ICD-10-CM | POA: Diagnosis not present

## 2015-01-26 DIAGNOSIS — Y9289 Other specified places as the place of occurrence of the external cause: Secondary | ICD-10-CM | POA: Diagnosis not present

## 2015-01-26 MED ORDER — HYDROCODONE-ACETAMINOPHEN 5-325 MG PO TABS
1.0000 | ORAL_TABLET | Freq: Once | ORAL | Status: AC
  Administered 2015-01-26: 1 via ORAL
  Filled 2015-01-26: qty 1

## 2015-01-26 NOTE — Discharge Instructions (Signed)
Please continue taking Ibuprofen and using ice for pain relief.  Please follow up with your primary care provider in 3-4 days. Please return to the Emergency Department if symptoms worsen or new onset of numbness, tingling, weakness.

## 2015-01-26 NOTE — ED Notes (Signed)
Pt reports he tumbled down a hill on Friday evening. Seen at [facility] and had neg xrays. Ambulating with cane. Pain in right knee has not improved

## 2015-01-26 NOTE — ED Provider Notes (Signed)
CSN: [id number]     Arrival date & time 01/26/15  1814 History   First MD Initiated Contact with Patient 01/26/15 1954     Chief Complaint  Patient presents with  . Fall     (Consider location/radiation/quality/duration/timing/severity/associated sxs/prior Treatment) HPI Comments: Pt is a 49 yo male who presents to the ED with complaint of fall, onset Friday. Pt reports he was walking up stairs at work when he tripped and fell on the side down a grass hill. Denies LOC or head injury. Endorses right lower leg pain and swelling/bruising. He reports having sharp pain that's worsened with walking. Denies numbness, tingling, weakness. He notes he was seen at a clinic on Friday and had xrays done which were negative. He notes he has been taking ibuprofen witout relief. Pt has been ambulating with cane since fall due to pain.    Past Medical History  Diagnosis Date  . Thyroid disease   . Anxiety and depression   . Hypothyroidism   . GERD (gastroesophageal reflux disease)   . Stomach ulcer   . Allergy   . Anxiety   . Depression   . Lyme disease    Past Surgical History  Procedure Laterality Date  . Cholecystectomy    . Appendectomy     Family History  Problem Relation Age of Onset  . Colon polyps Mother   . Heart disease Father   . Diabetes Father    Social History  Substance Use Topics  . Smoking status: Former Smoker    Quit date: 09/24/2012  . Smokeless tobacco: Never Used  . Alcohol Use: Yes     Comment: occasional/social    Review of Systems  Musculoskeletal: Positive for myalgias, joint swelling and arthralgias.  Skin: Negative for wound.  Neurological: Negative for weakness, numbness and headaches.      Allergies  Penicillins and Sulfonamide derivatives  Home Medications   Prior to Admission medications   Medication Sig Start Date End Date Taking? Authorizing Provider  buPROPion (WELLBUTRIN XL) 300 MG 24 hr tablet Take 300 mg by mouth daily.     Yes  Historical Provider, MD  clonazePAM (KLONOPIN) 2 MG tablet Take 2 mg by mouth 2 (two) times daily.    Yes Historical Provider, MD  mirtazapine (REMERON) 30 MG tablet Take 30 mg by mouth at bedtime.     Yes Historical Provider, MD  ondansetron (ZOFRAN-ODT) 8 MG disintegrating tablet Take 8 mg by mouth every 8 (eight) hours as needed for nausea or vomiting.   Yes Historical Provider, MD  SYNTHROID 150 MCG tablet Take 1 tablet by mouth daily before breakfast. 02/09/14  Yes Historical Provider, MD  topiramate (TOPAMAX) 100 MG tablet Take 100 mg by mouth 2 (two) times daily.    Yes Historical Provider, MD  zolpidem (AMBIEN) 10 MG tablet Take 5 mg by mouth at bedtime as needed for sleep.   Yes Historical Provider, MD  Aspirin-Acetaminophen-Caffeine (EXCEDRIN MIGRAINE PO) Take 2 tablets by mouth daily as needed (migraines).     Historical Provider, MD  cyclobenzaprine (FLEXERIL) 10 MG tablet Take 1 tablet (10 mg total) by mouth 2 (two) times daily as needed for muscle spasms. 03/26/14   Tatyana Kirichenko, PA-C  diazepam (VALIUM) 5 MG tablet Take 1 tablet (5 mg total) by mouth 2 (two) times daily. 04/01/14   Tatyana Kirichenko, PA-C  HYDROcodone-acetaminophen (NORCO) 5-325 MG per tablet Take 1 tablet by mouth every 6 (six) hours as needed. 03/26/14   Jaynie Crumble, PA-C  HYDROmorphone (DILAUDID) 2 MG tablet Take 1 tablet (2 mg total) by mouth every 4 (four) hours as needed for severe pain. Patient not taking: Reported on 03/26/2014 09/11/13   Dione Booze, MD  meloxicam (MOBIC) 15 MG tablet Take 15 mg by mouth daily.    Historical Provider, MD  naproxen (NAPROSYN) 500 MG tablet Take 1 tablet (500 mg total) by mouth 2 (two) times daily. 03/26/14   Tatyana Kirichenko, PA-C  sucralfate (CARAFATE) 1 GM/10ML suspension Take 10 mLs (1 g total) by mouth 4 (four) times daily -  with meals and at bedtime. 11/22/14   Earley Favor, NP   BP 120/75 mmHg  Pulse 79  Temp(Src) 98.4 F (36.9 C) (Oral)  Resp 18  Ht   (1.854 m)  Wt 210 lb (95.255 kg)  BMI 27.71 kg/m2  SpO2 97% Physical Exam  Constitutional: He is oriented to person, place, and time. He appears well-developed and well-nourished.  HENT:  Head: Normocephalic and atraumatic.  Eyes: Conjunctivae and EOM are normal. Right eye exhibits no discharge. Left eye exhibits no discharge. No scleral icterus.  Neck: Normal range of motion. Neck supple.  Pulmonary/Chest: Effort normal.  Musculoskeletal: He exhibits tenderness. He exhibits no edema.       Right ankle: He exhibits decreased range of motion (due to pain). He exhibits no swelling, no ecchymosis, no deformity and no laceration. Tenderness. Medial malleolus tenderness found. Achilles tendon normal.       Right lower leg: He exhibits tenderness. He exhibits no bony tenderness, no swelling, no edema, no deformity and no laceration.  Dec. ROM of right knee and ankle due to pain. Pt unable to stand on right foot due to pain, he has been using a cane at home to ambulate.  Neurological: He is alert and oriented to person, place, and time. He has normal reflexes.  Skin: Skin is warm and dry.  Nursing note and vitals reviewed.   ED Course  Procedures (including critical care time) Labs Review Labs Reviewed - No data to display  Imaging Review No results found. I have personally reviewed and evaluated these images and lab results as part of my medical decision-making.  Filed Vitals:   01/26/15 2207  BP: 134/96  Pulse: 70  Temp:   Resp: 18     MDM   Final diagnoses:  Pain of right lower extremity    Pt presents with right lower leg pain, swelling and bruising s/p fall that occurred 2 days ago. No LOC or head injury. Pt seen at clinic 2 days ago, reports knee xrays negative, no relief with ibuprofen. VSS. Pt is extremely TTP with light palpation of right leg. No swelling or ecchymosis noted, no deformity. Pt given pain meds in ED. Plan to order xrays due to being unable to see prior  xrays and I am unsure of what part of the pt's leg was xrayed/examined at clinic. Xray negative. Pt reports his pain has improved in the ED. Plan to d/c pt home. Advised pt to continue using Ibuprofen and ice for pain relief and to follow up with PCP in 3-4 days.   Evaluation does not show pathology requring ongoing emergent intervention or admission. Pt is hemodynamically stable and mentating appropriately. Discussed findings/results and plan with patient/guardian, who agrees with plan. All questions answered. Return precautions discussed and outpatient follow up given.    Satira Sark [place], [place] 01/27/15 1343  Courteney Randall An, MD 01/30/15 [phone]

## 2015-05-20 ENCOUNTER — Encounter: Payer: Self-pay | Admitting: Gastroenterology

## 2017-03-21 IMAGING — CR DG KNEE COMPLETE 4+V*R*
4 series · 4 of 4 positions shown · non-contrast
Comparison: None.

CLINICAL DATA: Status post fall.  Right knee pain.

EXAM:
RIGHT KNEE - COMPLETE 4+ VIEW

[view not recorded (1 of 4)]
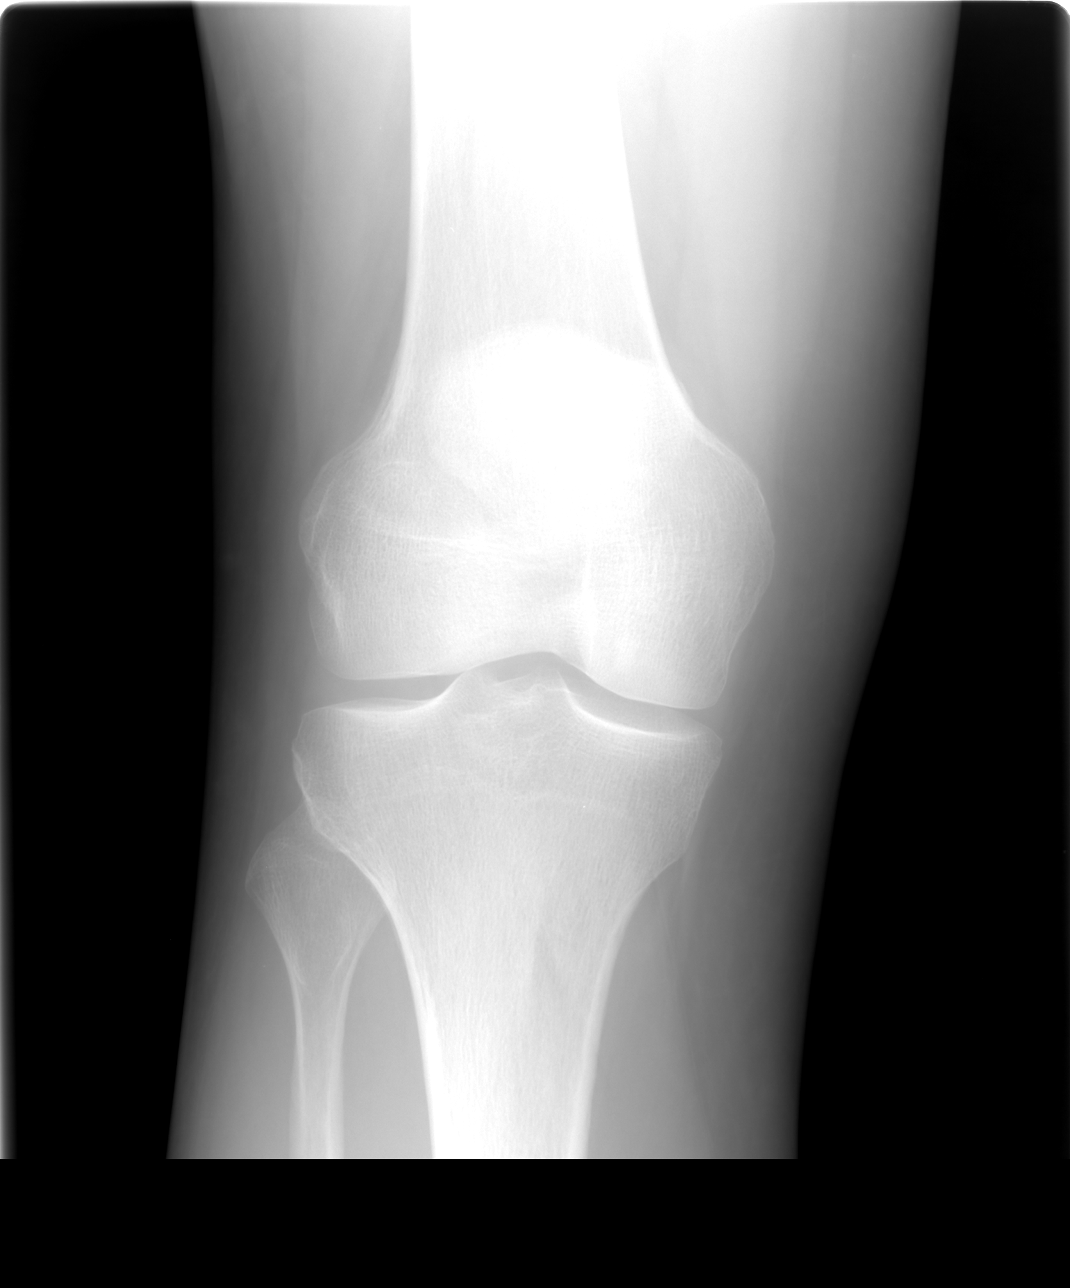

[view not recorded (2 of 4)]
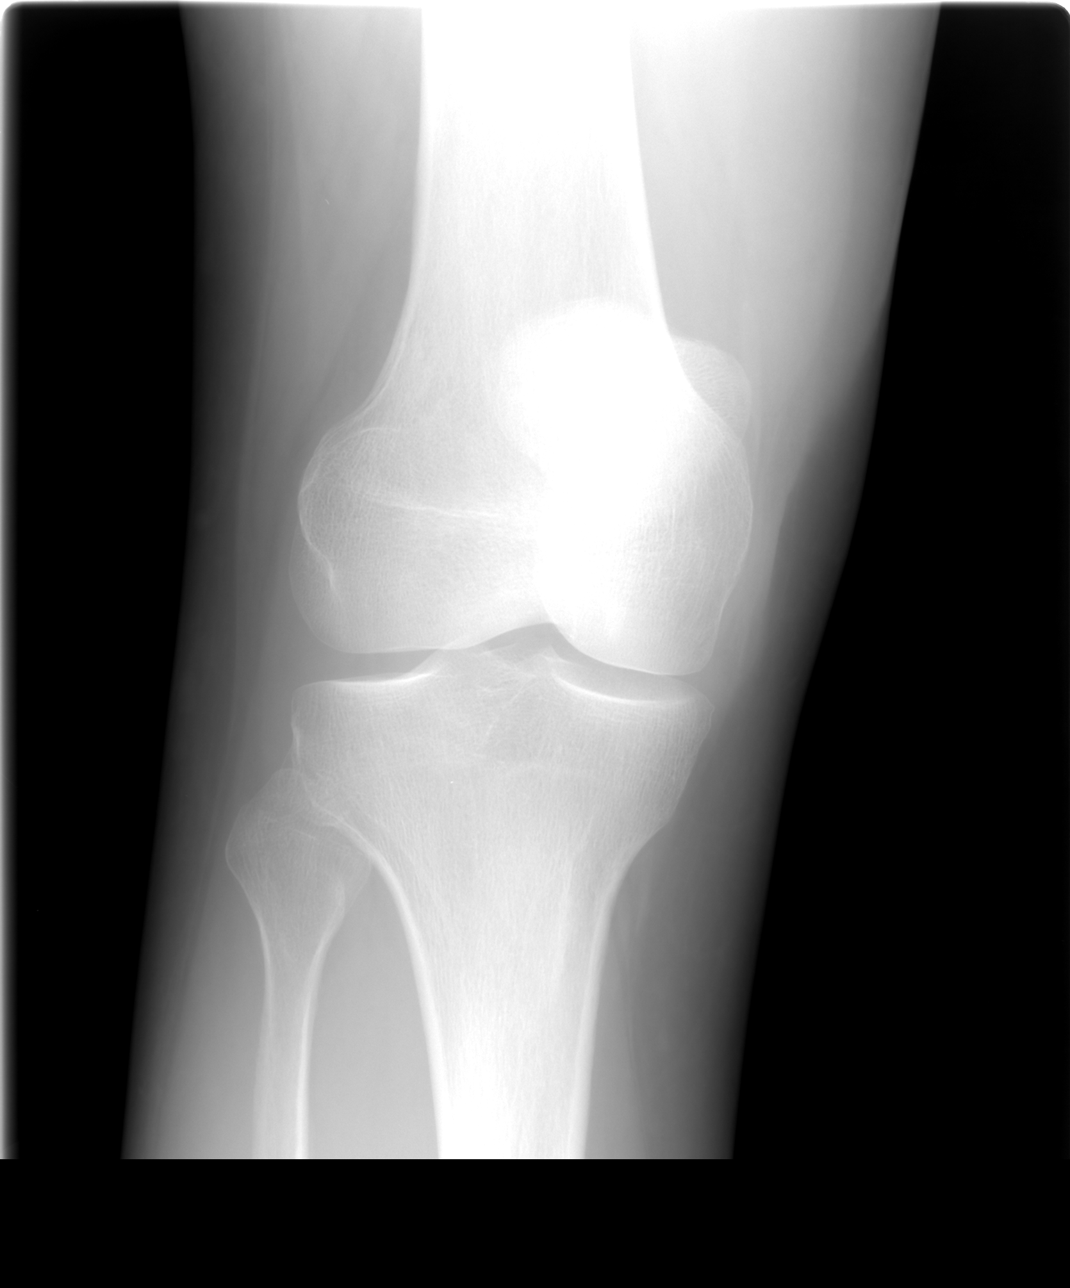

[view not recorded (3 of 4)]
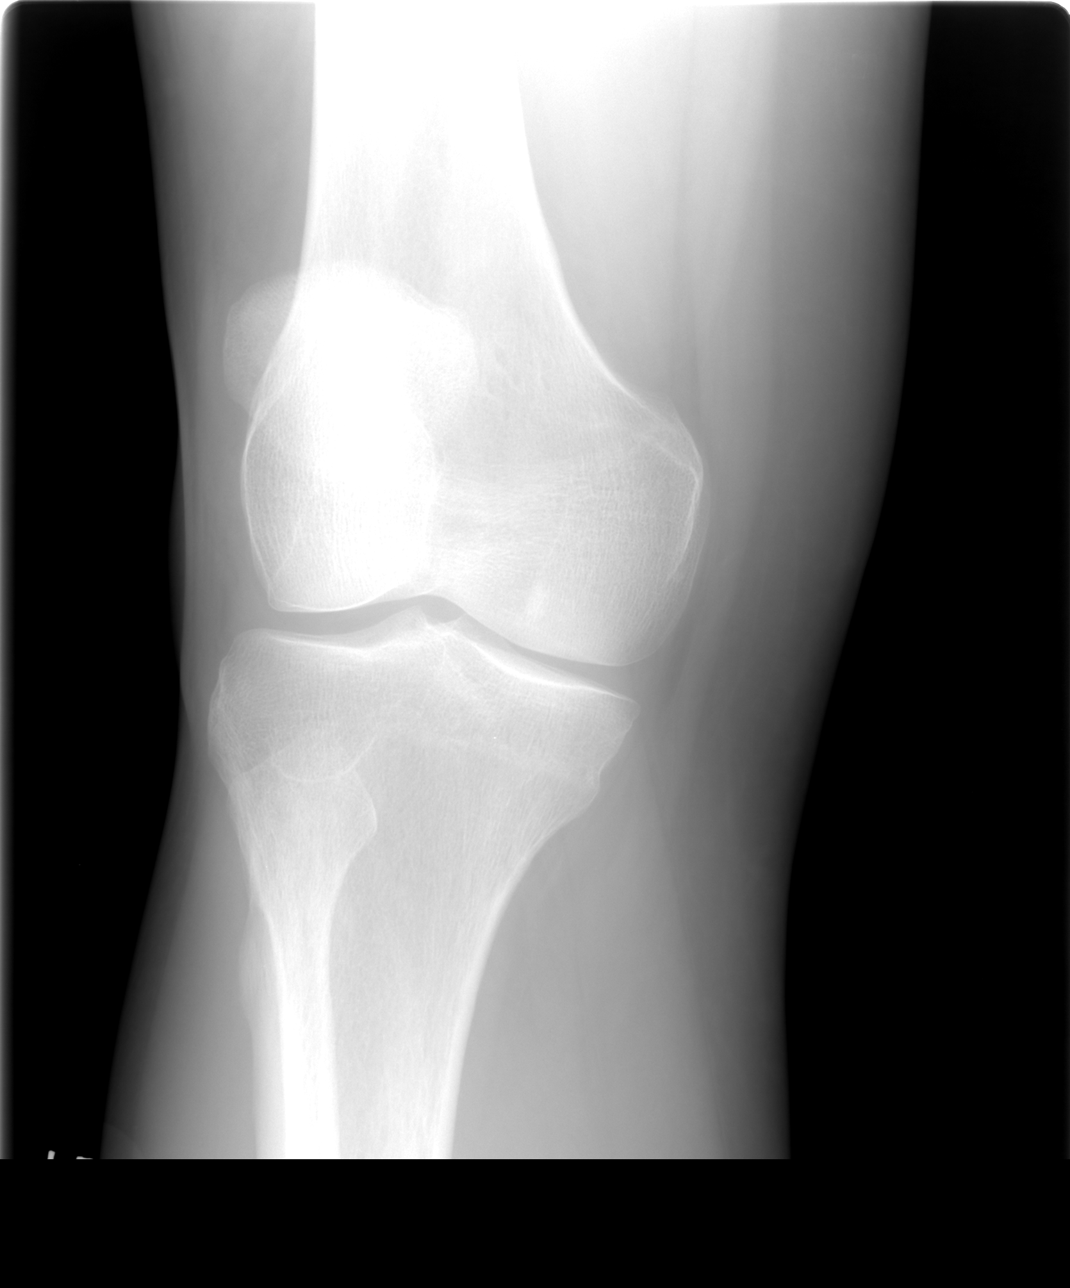

[view not recorded (4 of 4)]
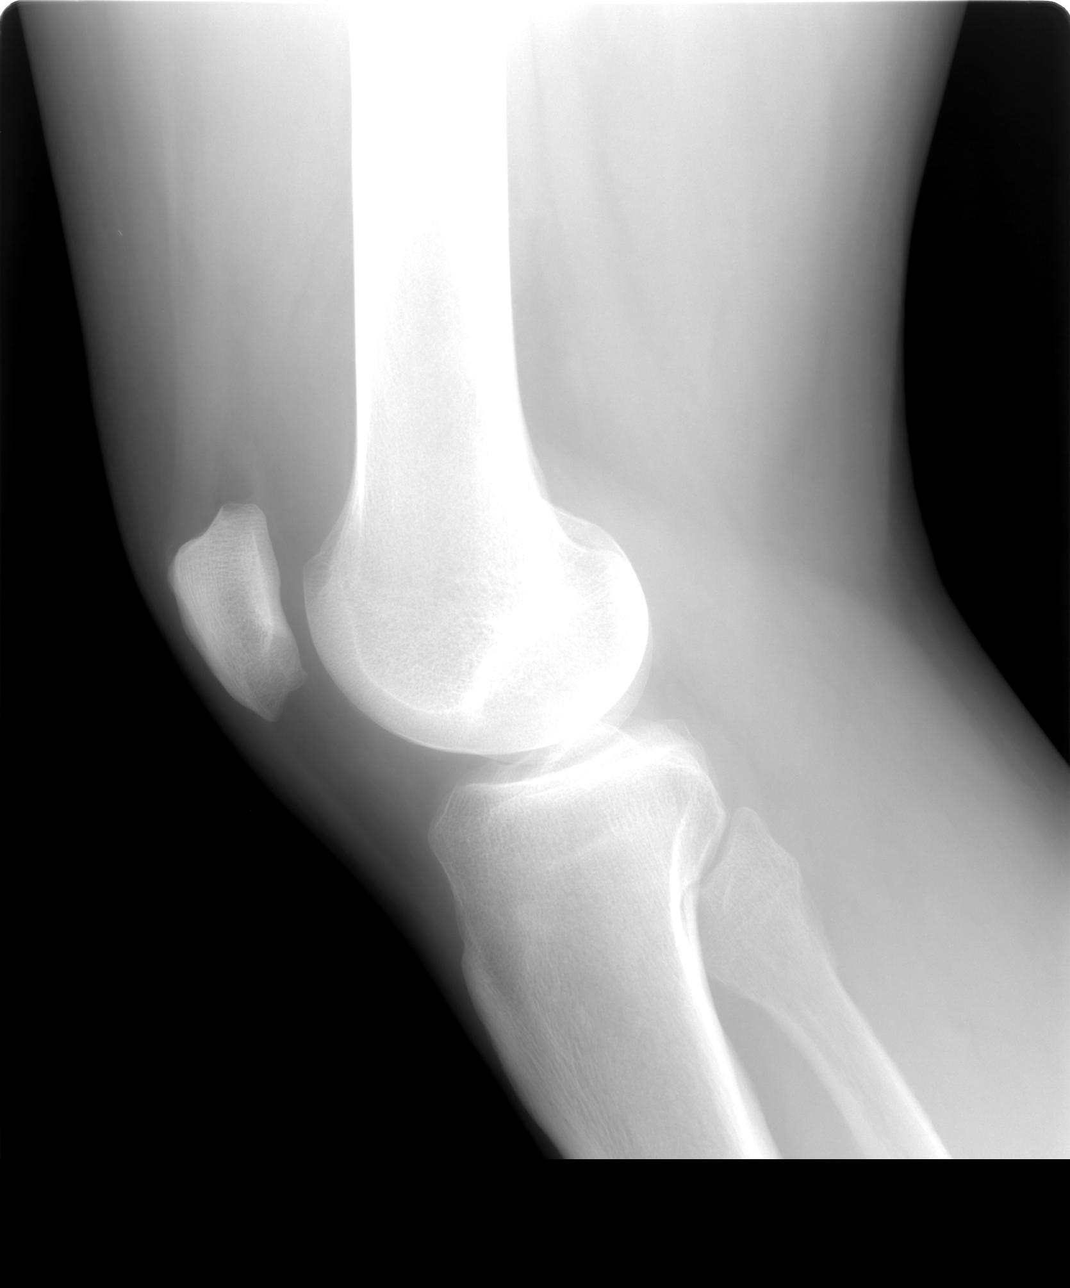

[4 of 4 positions shown; findings below may reference images not displayed]

FINDINGS: There is no evidence of fracture, dislocation, or joint effusion.
There is no evidence of arthropathy or other focal bone abnormality.
Soft tissues are unremarkable.
IMPRESSION: No acute osseous injury of the right knee.
